# Patient Record
Sex: Female | Born: 1980 | Hispanic: No | Smoking: Never smoker
Health system: Southern US, Community
[De-identification: ages and names within clinical notes are randomized; demographics above are authoritative.]

## PROBLEM LIST (undated history)

## (undated) DIAGNOSIS — Q519 Congenital malformation of uterus and cervix, unspecified: Secondary | ICD-10-CM

## (undated) DIAGNOSIS — R87612 Low grade squamous intraepithelial lesion on cytologic smear of cervix (LGSIL): Secondary | ICD-10-CM

## (undated) DIAGNOSIS — IMO0002 Reserved for concepts with insufficient information to code with codable children: Secondary | ICD-10-CM

## (undated) DIAGNOSIS — K760 Fatty (change of) liver, not elsewhere classified: Secondary | ICD-10-CM

## (undated) DIAGNOSIS — K2970 Gastritis, unspecified, without bleeding: Secondary | ICD-10-CM

## (undated) DIAGNOSIS — G43909 Migraine, unspecified, not intractable, without status migrainosus: Secondary | ICD-10-CM

## (undated) DIAGNOSIS — R17 Unspecified jaundice: Secondary | ICD-10-CM

## (undated) HISTORY — DX: Fatty (change of) liver, not elsewhere classified: K76.0

## (undated) HISTORY — DX: Reserved for concepts with insufficient information to code with codable children: IMO0002

## (undated) HISTORY — DX: Migraine, unspecified, not intractable, without status migrainosus: G43.909

## (undated) HISTORY — DX: Gastritis, unspecified, without bleeding: K29.70

## (undated) HISTORY — DX: Unspecified jaundice: R17

## (undated) HISTORY — DX: Low grade squamous intraepithelial lesion on cytologic smear of cervix (LGSIL): R87.612

## (undated) HISTORY — DX: Congenital malformation of uterus and cervix, unspecified: Q51.9

---

## 2007-03-03 ENCOUNTER — Other Ambulatory Visit: Admission: RE | Admit: 2007-03-03 | Discharge: 2007-03-03 | Payer: Self-pay | Admitting: Gynecology

## 2008-03-20 ENCOUNTER — Other Ambulatory Visit: Admission: RE | Admit: 2008-03-20 | Discharge: 2008-03-20 | Payer: Self-pay | Admitting: Gynecology

## 2008-03-20 ENCOUNTER — Encounter: Payer: Self-pay | Admitting: Gynecology

## 2008-03-20 ENCOUNTER — Ambulatory Visit: Payer: Self-pay | Admitting: Gynecology

## 2008-03-22 ENCOUNTER — Ambulatory Visit: Payer: Self-pay | Admitting: Gynecology

## 2008-03-22 ENCOUNTER — Encounter: Payer: Self-pay | Admitting: Gynecology

## 2008-03-22 ENCOUNTER — Other Ambulatory Visit: Admission: RE | Admit: 2008-03-22 | Discharge: 2008-03-22 | Payer: Self-pay | Admitting: Gynecology

## 2008-03-29 ENCOUNTER — Encounter: Admission: RE | Admit: 2008-03-29 | Discharge: 2008-03-29 | Payer: Self-pay | Admitting: Gynecology

## 2009-03-21 ENCOUNTER — Other Ambulatory Visit: Admission: RE | Admit: 2009-03-21 | Discharge: 2009-03-21 | Payer: Self-pay | Admitting: Gynecology

## 2009-03-21 ENCOUNTER — Ambulatory Visit: Payer: Self-pay | Admitting: Gynecology

## 2009-03-21 ENCOUNTER — Encounter: Payer: Self-pay | Admitting: Gynecology

## 2009-03-21 ENCOUNTER — Ambulatory Visit: Payer: Self-pay | Admitting: *Deleted

## 2009-03-25 ENCOUNTER — Ambulatory Visit: Payer: Self-pay | Admitting: Gynecology

## 2009-03-26 ENCOUNTER — Ambulatory Visit: Payer: Self-pay | Admitting: *Deleted

## 2009-03-28 ENCOUNTER — Ambulatory Visit: Payer: Self-pay | Admitting: *Deleted

## 2009-04-04 ENCOUNTER — Ambulatory Visit: Payer: Self-pay | Admitting: *Deleted

## 2009-04-09 ENCOUNTER — Ambulatory Visit: Payer: Self-pay | Admitting: *Deleted

## 2009-04-16 ENCOUNTER — Ambulatory Visit: Payer: Self-pay | Admitting: *Deleted

## 2009-07-15 ENCOUNTER — Ambulatory Visit: Payer: Self-pay | Admitting: Gynecology

## 2009-07-15 ENCOUNTER — Other Ambulatory Visit: Admission: RE | Admit: 2009-07-15 | Discharge: 2009-07-15 | Payer: Self-pay | Admitting: Gynecology

## 2009-10-03 ENCOUNTER — Ambulatory Visit (HOSPITAL_COMMUNITY): Admission: RE | Admit: 2009-10-03 | Discharge: 2009-10-03 | Payer: Self-pay | Admitting: Obstetrics and Gynecology

## 2009-10-23 HISTORY — PX: HYSTEROSCOPY: SHX211

## 2010-01-19 ENCOUNTER — Other Ambulatory Visit: Admission: RE | Admit: 2010-01-19 | Discharge: 2010-01-19 | Payer: Self-pay | Admitting: Gynecology

## 2010-01-19 ENCOUNTER — Ambulatory Visit: Payer: Self-pay | Admitting: Gynecology

## 2010-04-21 ENCOUNTER — Ambulatory Visit: Payer: Self-pay | Admitting: Gynecology

## 2010-04-21 ENCOUNTER — Other Ambulatory Visit
Admission: RE | Admit: 2010-04-21 | Discharge: 2010-04-21 | Payer: Self-pay | Source: Home / Self Care | Admitting: Gynecology

## 2010-08-18 LAB — PREGNANCY, URINE: Preg Test, Ur: NEGATIVE

## 2010-08-18 LAB — CBC
HCT: 42.1 % (ref 36.0–46.0)
MCHC: 34.4 g/dL (ref 30.0–36.0)
RDW: 12.5 % (ref 11.5–15.5)
WBC: 7.1 10*3/uL (ref 4.0–10.5)

## 2010-10-22 ENCOUNTER — Other Ambulatory Visit: Payer: Self-pay | Admitting: Gynecology

## 2010-10-22 ENCOUNTER — Ambulatory Visit (INDEPENDENT_AMBULATORY_CARE_PROVIDER_SITE_OTHER): Payer: BC Managed Care – PPO | Admitting: Gynecology

## 2010-10-22 ENCOUNTER — Other Ambulatory Visit (HOSPITAL_COMMUNITY)
Admission: RE | Admit: 2010-10-22 | Discharge: 2010-10-22 | Disposition: A | Discharge status: Home / Self Care | Payer: BC Managed Care – PPO | Source: Ambulatory Visit | Attending: Gynecology | Admitting: Gynecology

## 2010-10-22 DIAGNOSIS — R87619 Unspecified abnormal cytological findings in specimens from cervix uteri: Secondary | ICD-10-CM | POA: Insufficient documentation

## 2010-10-22 DIAGNOSIS — N870 Mild cervical dysplasia: Secondary | ICD-10-CM

## 2011-03-04 ENCOUNTER — Other Ambulatory Visit: Payer: Self-pay | Admitting: Gynecology

## 2011-04-28 ENCOUNTER — Other Ambulatory Visit (HOSPITAL_COMMUNITY)
Admission: RE | Admit: 2011-04-28 | Discharge: 2011-04-28 | Disposition: A | Discharge status: Home / Self Care | Payer: BC Managed Care – PPO | Source: Ambulatory Visit | Attending: Gynecology | Admitting: Gynecology

## 2011-04-28 ENCOUNTER — Ambulatory Visit (INDEPENDENT_AMBULATORY_CARE_PROVIDER_SITE_OTHER): Payer: BC Managed Care – PPO | Admitting: Gynecology

## 2011-04-28 ENCOUNTER — Encounter: Payer: Self-pay | Admitting: Gynecology

## 2011-04-28 VITALS — BP 120/76 | Ht 60.0 in | Wt 139.5 lb

## 2011-04-28 DIAGNOSIS — Q519 Congenital malformation of uterus and cervix, unspecified: Secondary | ICD-10-CM

## 2011-04-28 DIAGNOSIS — Z87410 Personal history of cervical dysplasia: Secondary | ICD-10-CM

## 2011-04-28 DIAGNOSIS — Z01419 Encounter for gynecological examination (general) (routine) without abnormal findings: Secondary | ICD-10-CM

## 2011-04-28 DIAGNOSIS — Z131 Encounter for screening for diabetes mellitus: Secondary | ICD-10-CM

## 2011-04-28 DIAGNOSIS — Z13220 Encounter for screening for lipoid disorders: Secondary | ICD-10-CM

## 2011-04-28 DIAGNOSIS — R82998 Other abnormal findings in urine: Secondary | ICD-10-CM

## 2011-04-28 MED ORDER — NORGESTIMATE-ETH ESTRADIOL 0.25-35 MG-MCG PO TABS: 1.0000 | ORAL_TABLET | Freq: Every day | ORAL | Status: DC

## 2011-04-28 NOTE — Progress Notes (Signed)
Kaitlin Avila de Felipe Drone 1981/01/26 [id number]        30 y.o.  for annual exam.  Doing well. History of septated uterus, double cervix, longitudinal vaginal septum. She is status post vaginal septum excision and incision of her uterine septum by Dr. April Manson May 2011. She also has a history of low-grade SIL in 2010, her follow up Pap smears have been negative.  Past medical history,surgical history, medications, allergies, family history and social history were all reviewed and documented in the EPIC chart. ROS:  Was performed and pertinent positives and negatives are included in the history.  Exam: chaperone present Filed Vitals:   04/28/11 0858  BP: 120/76   General appearance  Normal Skin grossly normal Head/Neck normal with no cervical or supraclavicular adenopathy thyroid normal Lungs  clear Cardiac RR, without RMG Abdominal  soft, nontender, without masses, organomegaly or hernia Breasts  examined lying and sitting without masses, retractions, discharge or axillary adenopathy. Pelvic  Ext/BUS/vagina  normal  Cervix  Double-barreled separate cervices right and left Pap smears done  Uterus  axial, normal size, shape and contour, midline and mobile nontender   Adnexa  Without masses or tenderness    Anus and perineum  normal     Assessment/Plan:  30 y.o. female for annual exam.    1. 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 Underwent vaginal septum excision with incision of her uterine septum by Dr. April Manson in May 2011. 2. History cervical dysplasia. Patient has history of low-grade SIL changes in 2010. Her Pap smears on both right and left cervixes were normal February 2011, August 2011, November 2011 and May 2012. I did a Pap smear today if normal discussed annual repeat and at some point we'll go to a less frequent screening interval per current  guidelines. 3. Birth control. Patient is on low-dose oral contraceptives doing well and wants to continue. I reviewed the risks of thrombosis to include stroke heart attack DVT. Patient accepts and I refilled her x1 year. 4. Breast health. SBE monthly reviewed. 5. Health maintenance. CBC glucose lipid profile urinalysis were ordered. Assuming she continues well from a gynecologic standpoint she will see me in a year sooner as needed.    [name] MD, 9:28 AM 04/28/2011

## 2011-04-28 NOTE — Progress Notes (Signed)
Addended by: Richardson Chiquito on: 04/28/2011 02:53 PM   Modules accepted: Orders

## 2012-05-03 ENCOUNTER — Ambulatory Visit (INDEPENDENT_AMBULATORY_CARE_PROVIDER_SITE_OTHER): Payer: BC Managed Care – PPO | Admitting: Gynecology

## 2012-05-03 ENCOUNTER — Encounter: Payer: Self-pay | Admitting: Gynecology

## 2012-05-03 ENCOUNTER — Other Ambulatory Visit (HOSPITAL_COMMUNITY)
Admission: RE | Admit: 2012-05-03 | Discharge: 2012-05-03 | Disposition: A | Discharge status: Home / Self Care | Payer: BC Managed Care – PPO | Source: Ambulatory Visit | Attending: Gynecology | Admitting: Gynecology

## 2012-05-03 VITALS — BP 130/62 | Ht 60.0 in | Wt 133.0 lb

## 2012-05-03 DIAGNOSIS — Z01419 Encounter for gynecological examination (general) (routine) without abnormal findings: Secondary | ICD-10-CM | POA: Insufficient documentation

## 2012-05-03 DIAGNOSIS — Z131 Encounter for screening for diabetes mellitus: Secondary | ICD-10-CM

## 2012-05-03 DIAGNOSIS — R102 Pelvic and perineal pain: Secondary | ICD-10-CM

## 2012-05-03 DIAGNOSIS — Z13220 Encounter for screening for lipoid disorders: Secondary | ICD-10-CM

## 2012-05-03 DIAGNOSIS — N949 Unspecified condition associated with female genital organs and menstrual cycle: Secondary | ICD-10-CM

## 2012-05-03 LAB — CBC WITH DIFFERENTIAL/PLATELET
Lymphocytes Relative: 44 % (ref 12–46)
Lymphs Abs: 2.7 10*3/uL (ref 0.7–4.0)
MCHC: 33 g/dL (ref 30.0–36.0)
MCV: 92.8 fL (ref 78.0–100.0)
Monocytes Absolute: 0.5 10*3/uL (ref 0.1–1.0)
Monocytes Relative: 9 % (ref 3–12)
Neutro Abs: 2.7 10*3/uL (ref 1.7–7.7)
RDW: 13.3 % (ref 11.5–15.5)
WBC: 6 10*3/uL (ref 4.0–10.5)

## 2012-05-03 LAB — LIPID PANEL: VLDL: 16 mg/dL (ref 0–40)

## 2012-05-03 MED ORDER — NORGESTIMATE-ETH ESTRADIOL 0.25-35 MG-MCG PO TABS: 1.0000 | ORAL_TABLET | Freq: Every day | ORAL | Status: DC

## 2012-05-03 NOTE — Progress Notes (Signed)
Kaitlin Avila de Felipe Drone Oct 17, 1980 [id number]        31 y.o.  G0P0 for annual exam.  Several issues noted below  Past medical history,surgical history, medications, allergies, family history and social history were all reviewed and documented in the EPIC chart. ROS:  Was performed and pertinent positives and negatives are included in the history.  Exam: Sherrilyn Rist assistant Filed Vitals:   05/03/12 0942  BP: 130/62  Height: 5' (1.524 m)  Weight: 133 lb (60.328 kg)   General appearance  Normal Skin grossly normal Head/Neck normal with no cervical or supraclavicular adenopathy thyroid normal Lungs  clear Cardiac RR, without RMG Abdominal  soft, nontender, without masses, organomegaly or hernia Breasts  examined lying and sitting without masses, retractions, discharge or axillary adenopathy. Pelvic  Ext/BUS/vagina  normal   Cervix  Double barrel cervix with 2 offices. Pap of right and left cervix done  Uterus  Generous in size, midline and mobile nontender   Adnexa  Without masses or tenderness    Anus and perineum  normal   Rectovaginal  normal sphincter tone without palpated masses or tenderness.    Assessment/Plan:  31 y.o. G0P0 female for annual exam.  1. History of uterine/vaginal septum lysed by Dr. April Manson. Has double cervix with history of LGSIL right side 2010 and ASCUS negative high risk HPV left side 2011. Follow up Pap smears 2011 and 2012 normal. Pap of age cervix done today. If negative then plan less frequent screening at every 3 years. 2. Fleeting pelvic pain right and left. I think this is probably physiologic but will check ultrasound for baseline rule out ovarian process and she'll follow up for this. 3. Birth control. Patient on BCPs and wants to continue it I refilled her times a year. Recommended multivitamin with folic acid now as they are contemplating pregnancy sometime in the next year or 2. Need to follow up early for obstetrical care given history of  uterine/cervical abnormalities. 4. Breast health. SBE monthly reviewed. 5. Health maintenance. Baseline CBC lipid profile glucose urinalysis ordered. Follow for ultrasound otherwise annually.    [name] MD, 10:24 AM 05/03/2012

## 2012-05-03 NOTE — Patient Instructions (Signed)
Follow up for ultrasound Follow up in one year for annual exam

## 2012-05-04 LAB — URINALYSIS W MICROSCOPIC + REFLEX CULTURE
Bacteria, UA: NONE SEEN
Bilirubin Urine: NEGATIVE
Casts: NONE SEEN
Crystals: NONE SEEN
Leukocytes, UA: NEGATIVE
Squamous Epithelial / LPF: NONE SEEN
Urobilinogen, UA: 0.2 mg/dL (ref 0.0–1.0)

## 2012-05-12 ENCOUNTER — Encounter: Payer: Self-pay | Admitting: Gynecology

## 2012-05-12 ENCOUNTER — Ambulatory Visit (INDEPENDENT_AMBULATORY_CARE_PROVIDER_SITE_OTHER): Payer: BC Managed Care – PPO

## 2012-05-12 ENCOUNTER — Ambulatory Visit (INDEPENDENT_AMBULATORY_CARE_PROVIDER_SITE_OTHER): Payer: BC Managed Care – PPO | Admitting: Gynecology

## 2012-05-12 DIAGNOSIS — R102 Pelvic and perineal pain: Secondary | ICD-10-CM

## 2012-05-12 DIAGNOSIS — N949 Unspecified condition associated with female genital organs and menstrual cycle: Secondary | ICD-10-CM

## 2012-05-12 NOTE — Progress Notes (Signed)
Patient presents for ultrasound. History of fleeting right and left pelvic pain.  Ultrasound is normal noting normal uterine size and echotexture. Endometrial echo 5.2 mm. Right left ear was visualized with physiologic changes. Cul-de-sac negative.  Assessment and plan: Fleeting pelvic pain. Patient will monitor as long as not bothersome or results will follow. Otherwise if persists or worsens consider laparoscopy.

## 2012-05-12 NOTE — Patient Instructions (Signed)
Follow up routinely, sooner if any issues.

## 2012-05-16 ENCOUNTER — Encounter: Payer: Self-pay | Admitting: Gynecology

## 2012-05-31 DIAGNOSIS — A749 Chlamydial infection, unspecified: Secondary | ICD-10-CM

## 2012-05-31 HISTORY — DX: Chlamydial infection, unspecified: A74.9

## 2012-05-31 NOTE — L&D Delivery Note (Signed)
Operative Delivery Note At 7:49 PM a viable and healthy female was delivered via Vaginal, Vacuum Investment banker, operational).  Presentation: vertex; Position: Occiput,, Anterior; Station: +3.  Verbal consent: obtained from patient.  Risks and benefits discussed in detail.  Risks include, but are not limited to the risks of anesthesia, bleeding, infection, damage to maternal tissues, fetal cephalhematoma.  There is also the risk of inability to effect vaginal delivery of the head, or shoulder dystocia that cannot be resolved by established maneuvers, leading to the need for emergency cesarean section.  APGAR: 6, 9; weight 6 lb 13.2 oz (3096 g).   Placenta status: , Spontaneous.  Intact not sent Cord: 3 vessels with the following complications: None.  CAN x 1 reducible Cord pH: none  Anesthesia: Epidural  Instruments: mushroom vacuum Episiotomy: None Lacerations: 2nd degree;Perineal;Labial ( right) and left interlabial fold; left Sulcus;Vaginal Suture Repair: 3.0 chromic Est. Blood Loss (mL): 350  Mom to postpartum.  Baby to Couplet care / Skin to Skin.  Kaitlin Avila A 05/30/2013, 9:08 PM

## 2012-07-15 ENCOUNTER — Other Ambulatory Visit: Payer: Self-pay

## 2012-10-24 ENCOUNTER — Encounter: Payer: Self-pay | Admitting: Gynecology

## 2012-10-24 ENCOUNTER — Ambulatory Visit (INDEPENDENT_AMBULATORY_CARE_PROVIDER_SITE_OTHER): Payer: BC Managed Care – PPO | Admitting: Gynecology

## 2012-10-24 DIAGNOSIS — N912 Amenorrhea, unspecified: Secondary | ICD-10-CM

## 2012-10-24 DIAGNOSIS — O34599 Maternal care for other abnormalities of gravid uterus, unspecified trimester: Secondary | ICD-10-CM

## 2012-10-24 DIAGNOSIS — O34591 Maternal care for other abnormalities of gravid uterus, first trimester: Secondary | ICD-10-CM

## 2012-10-24 NOTE — Progress Notes (Signed)
Patient presents with LMP 08/30/2012 and positive home pregnancy test. No bleeding or cramping or significant pain since then.  Exam was Kaitlin Avila Abdomen soft nontender without masses guarding rebound organomegaly. Pelvic external BUS vagina normal. Cervix with double barrel right and left cervix cyst. Bimanual with globoid uterus retroverted nontender. Adnexa without masses or tenderness  Assessment and plan: IUP 7 weeks by dates. Exam consistent with dates. History of vaginal septum, septated uterus and double cervices. Status post hysteroscopic excision of vaginal septum and uterine septum by Dr. April Manson start with ultrasound now for documentation of viability. Risk of preterm delivery reviewed. Need to establish obstetrical care early discussed and she knows that she'll need to make an appointment to see an obstetrician as we do not do obstetrics. She is on a multivitamin with folic acid.

## 2012-10-24 NOTE — Patient Instructions (Signed)
Followup for ultrasound as scheduled Make an appointment to see an obstetrician for ongoing obstetrical care

## 2012-11-07 LAB — OB RESULTS CONSOLE RUBELLA ANTIBODY, IGM: Rubella: IMMUNE

## 2012-11-07 LAB — OB RESULTS CONSOLE ANTIBODY SCREEN: Antibody Screen: NEGATIVE

## 2012-11-07 LAB — OB RESULTS CONSOLE ABO/RH: RH Type: POSITIVE

## 2012-11-07 LAB — OB RESULTS CONSOLE HIV ANTIBODY (ROUTINE TESTING): HIV: NONREACTIVE

## 2012-11-07 LAB — OB RESULTS CONSOLE HEPATITIS B SURFACE ANTIGEN: Hepatitis B Surface Ag: NEGATIVE

## 2012-11-15 ENCOUNTER — Other Ambulatory Visit: Payer: BC Managed Care – PPO

## 2012-11-15 ENCOUNTER — Ambulatory Visit: Payer: BC Managed Care – PPO | Admitting: Gynecology

## 2013-04-05 ENCOUNTER — Other Ambulatory Visit: Payer: Self-pay

## 2013-05-09 LAB — OB RESULTS CONSOLE GBS: GBS: NEGATIVE

## 2013-05-30 ENCOUNTER — Encounter (HOSPITAL_COMMUNITY): Payer: BC Managed Care – PPO | Admitting: Anesthesiology

## 2013-05-30 ENCOUNTER — Inpatient Hospital Stay (HOSPITAL_COMMUNITY)
Admission: AD | Admit: 2013-05-30 | Discharge: 2013-06-01 | DRG: 775 | Disposition: A | Discharge status: Home / Self Care | Payer: BC Managed Care – PPO | Source: Ambulatory Visit | Attending: Obstetrics and Gynecology | Admitting: Obstetrics and Gynecology

## 2013-05-30 ENCOUNTER — Inpatient Hospital Stay (HOSPITAL_COMMUNITY): Payer: BC Managed Care – PPO | Admitting: Anesthesiology

## 2013-05-30 ENCOUNTER — Encounter (HOSPITAL_COMMUNITY): Payer: Self-pay | Admitting: *Deleted

## 2013-05-30 DIAGNOSIS — Q5128 Other doubling of uterus, other specified: Secondary | ICD-10-CM

## 2013-05-30 DIAGNOSIS — O3400 Maternal care for unspecified congenital malformation of uterus, unspecified trimester: Secondary | ICD-10-CM | POA: Diagnosis present

## 2013-05-30 LAB — CBC
Hemoglobin: 13.4 g/dL (ref 12.0–15.0)
MCH: 31.1 pg (ref 26.0–34.0)
MCHC: 34.8 g/dL (ref 30.0–36.0)
MCV: 89.3 fL (ref 78.0–100.0)
Platelets: 247 10*3/uL (ref 150–400)
RBC: 4.31 MIL/uL (ref 3.87–5.11)
RDW: 12.9 % (ref 11.5–15.5)

## 2013-05-30 LAB — TYPE AND SCREEN
ABO/RH(D): O POS
Antibody Screen: NEGATIVE

## 2013-05-30 LAB — RPR: RPR Ser Ql: NONREACTIVE

## 2013-05-30 MED ORDER — ONDANSETRON HCL 4 MG PO TABS: 4.0000 mg | ORAL_TABLET | ORAL | Status: DC | PRN

## 2013-05-30 MED ORDER — OXYTOCIN 40 UNITS IN LACTATED RINGERS INFUSION - SIMPLE MED
62.5000 mL/h | INTRAVENOUS | Status: DC
  Filled 2013-05-30: qty 1000

## 2013-05-30 MED ORDER — WITCH HAZEL-GLYCERIN EX PADS: 1.0000 "application " | MEDICATED_PAD | CUTANEOUS | Status: DC | PRN

## 2013-05-30 MED ORDER — OXYCODONE-ACETAMINOPHEN 5-325 MG PO TABS: 1.0000 | ORAL_TABLET | ORAL | Status: DC | PRN

## 2013-05-30 MED ORDER — BUTORPHANOL TARTRATE 1 MG/ML IJ SOLN
1.0000 mg | INTRAMUSCULAR | Status: DC | PRN
  Administered 2013-05-30: 1 mg via INTRAVENOUS
  Filled 2013-05-30: qty 1

## 2013-05-30 MED ORDER — ZOLPIDEM TARTRATE 5 MG PO TABS: 5.0000 mg | ORAL_TABLET | Freq: Every evening | ORAL | Status: DC | PRN

## 2013-05-30 MED ORDER — DIPHENHYDRAMINE HCL 50 MG/ML IJ SOLN: 12.5000 mg | INTRAMUSCULAR | Status: DC | PRN

## 2013-05-30 MED ORDER — PHENYLEPHRINE 40 MCG/ML (10ML) SYRINGE FOR IV PUSH (FOR BLOOD PRESSURE SUPPORT)
80.0000 ug | PREFILLED_SYRINGE | INTRAVENOUS | Status: DC | PRN
  Filled 2013-05-30: qty 2
  Filled 2013-05-30: qty 10

## 2013-05-30 MED ORDER — TERBUTALINE SULFATE 1 MG/ML IJ SOLN: 0.2500 mg | Freq: Once | INTRAMUSCULAR | Status: DC | PRN

## 2013-05-30 MED ORDER — OXYTOCIN 40 UNITS IN LACTATED RINGERS INFUSION - SIMPLE MED
1.0000 m[IU]/min | INTRAVENOUS | Status: DC
  Administered 2013-05-30: 2 m[IU]/min via INTRAVENOUS

## 2013-05-30 MED ORDER — FENTANYL 2.5 MCG/ML BUPIVACAINE 1/10 % EPIDURAL INFUSION (WH - ANES)
14.0000 mL/h | INTRAMUSCULAR | Status: DC | PRN
  Administered 2013-05-30 (×2): 14 mL/h via EPIDURAL
  Filled 2013-05-30 (×2): qty 125

## 2013-05-30 MED ORDER — FLEET ENEMA 7-19 GM/118ML RE ENEM: 1.0000 | ENEMA | RECTAL | Status: DC | PRN

## 2013-05-30 MED ORDER — ONDANSETRON HCL 4 MG/2ML IJ SOLN: 4.0000 mg | INTRAMUSCULAR | Status: DC | PRN

## 2013-05-30 MED ORDER — IBUPROFEN 600 MG PO TABS
600.0000 mg | ORAL_TABLET | Freq: Four times a day (QID) | ORAL | Status: DC
  Administered 2013-05-30 – 2013-06-01 (×8): 600 mg via ORAL
  Filled 2013-05-30 (×8): qty 1

## 2013-05-30 MED ORDER — LACTATED RINGERS IV SOLN: 500.0000 mL | Freq: Once | INTRAVENOUS | Status: DC

## 2013-05-30 MED ORDER — SIMETHICONE 80 MG PO CHEW: 80.0000 mg | CHEWABLE_TABLET | ORAL | Status: DC | PRN

## 2013-05-30 MED ORDER — EPHEDRINE 5 MG/ML INJ
10.0000 mg | INTRAVENOUS | Status: DC | PRN
Start: 2013-05-30 — End: 2013-05-30
  Filled 2013-05-30: qty 2

## 2013-05-30 MED ORDER — LACTATED RINGERS IV SOLN
INTRAVENOUS | Status: DC
  Administered 2013-05-30: 150 mL/h via INTRAUTERINE

## 2013-05-30 MED ORDER — EPHEDRINE 5 MG/ML INJ
10.0000 mg | INTRAVENOUS | Status: DC | PRN
  Filled 2013-05-30: qty 2
  Filled 2013-05-30: qty 4

## 2013-05-30 MED ORDER — FERROUS SULFATE 325 (65 FE) MG PO TABS
325.0000 mg | ORAL_TABLET | Freq: Two times a day (BID) | ORAL | Status: DC
  Administered 2013-05-31: 325 mg via ORAL
  Filled 2013-05-30: qty 1

## 2013-05-30 MED ORDER — LANOLIN HYDROUS EX OINT: TOPICAL_OINTMENT | CUTANEOUS | Status: DC | PRN

## 2013-05-30 MED ORDER — CITRIC ACID-SODIUM CITRATE 334-500 MG/5ML PO SOLN: 30.0000 mL | ORAL | Status: DC | PRN

## 2013-05-30 MED ORDER — LIDOCAINE HCL (PF) 1 % IJ SOLN
30.0000 mL | INTRAMUSCULAR | Status: DC | PRN
  Administered 2013-05-30: 30 mL via SUBCUTANEOUS
  Filled 2013-05-30 (×2): qty 30

## 2013-05-30 MED ORDER — OXYTOCIN BOLUS FROM INFUSION
500.0000 mL | INTRAVENOUS | Status: DC
  Administered 2013-05-30: 500 mL via INTRAVENOUS

## 2013-05-30 MED ORDER — PRENATAL MULTIVITAMIN CH
1.0000 | ORAL_TABLET | Freq: Every day | ORAL | Status: DC
  Administered 2013-05-31 – 2013-06-01 (×2): 1 via ORAL
  Filled 2013-05-30 (×2): qty 1

## 2013-05-30 MED ORDER — DIPHENHYDRAMINE HCL 25 MG PO CAPS: 25.0000 mg | ORAL_CAPSULE | Freq: Four times a day (QID) | ORAL | Status: DC | PRN

## 2013-05-30 MED ORDER — DIBUCAINE 1 % RE OINT: 1.0000 "application " | TOPICAL_OINTMENT | RECTAL | Status: DC | PRN

## 2013-05-30 MED ORDER — IBUPROFEN 600 MG PO TABS: 600.0000 mg | ORAL_TABLET | Freq: Four times a day (QID) | ORAL | Status: DC | PRN

## 2013-05-30 MED ORDER — LIDOCAINE HCL (PF) 1 % IJ SOLN
INTRAMUSCULAR | Status: DC | PRN
  Administered 2013-05-30 (×2): 5 mL

## 2013-05-30 MED ORDER — LACTATED RINGERS IV SOLN
500.0000 mL | INTRAVENOUS | Status: DC | PRN
  Administered 2013-05-30: 500 mL via INTRAVENOUS

## 2013-05-30 MED ORDER — ACETAMINOPHEN 325 MG PO TABS: 650.0000 mg | ORAL_TABLET | ORAL | Status: DC | PRN

## 2013-05-30 MED ORDER — ONDANSETRON HCL 4 MG/2ML IJ SOLN: 4.0000 mg | Freq: Four times a day (QID) | INTRAMUSCULAR | Status: DC | PRN

## 2013-05-30 MED ORDER — LACTATED RINGERS IV SOLN
INTRAVENOUS | Status: DC
  Administered 2013-05-30 (×3): 125 mL/h via INTRAVENOUS
  Administered 2013-05-30: 06:00:00 via INTRAVENOUS

## 2013-05-30 MED ORDER — BENZOCAINE-MENTHOL 20-0.5 % EX AERO
1.0000 "application " | INHALATION_SPRAY | CUTANEOUS | Status: DC | PRN
  Administered 2013-05-30: 1 via TOPICAL
  Filled 2013-05-30: qty 56

## 2013-05-30 MED ORDER — PHENYLEPHRINE 40 MCG/ML (10ML) SYRINGE FOR IV PUSH (FOR BLOOD PRESSURE SUPPORT)
80.0000 ug | PREFILLED_SYRINGE | INTRAVENOUS | Status: DC | PRN
  Filled 2013-05-30: qty 2

## 2013-05-30 MED ORDER — SENNOSIDES-DOCUSATE SODIUM 8.6-50 MG PO TABS
2.0000 | ORAL_TABLET | ORAL | Status: DC
Start: 2013-05-31 — End: 2013-06-01
  Administered 2013-05-30 – 2013-05-31 (×2): 2 via ORAL
  Filled 2013-05-30 (×2): qty 2

## 2013-05-30 NOTE — Anesthesia Preprocedure Evaluation (Signed)
Anesthesia Evaluation  Patient identified by MRN, date of birth, ID band Patient awake    Reviewed: Allergy & Precautions, H&P , Patient's Chart, lab work & pertinent test results  Airway Mallampati: II TM Distance: >3 FB Neck ROM: full    Dental   Pulmonary  breath sounds clear to auscultation        Cardiovascular Rhythm:regular Rate:Normal     Neuro/Psych  Headaches,    GI/Hepatic   Endo/Other    Renal/GU      Musculoskeletal   Abdominal   Peds  Hematology   Anesthesia Other Findings   Reproductive/Obstetrics (+) Pregnancy                           Anesthesia Physical Anesthesia Plan  ASA: II  Anesthesia Plan: Epidural   Post-op Pain Management:    Induction:   Airway Management Planned:   Additional Equipment:   Intra-op Plan:   Post-operative Plan:   Informed Consent: I have reviewed the patients History and Physical, chart, labs and discussed the procedure including the risks, benefits and alternatives for the proposed anesthesia with the patient or authorized representative who has indicated his/her understanding and acceptance.     Plan Discussed with:   Anesthesia Plan Comments:         Anesthesia Quick Evaluation  

## 2013-05-30 NOTE — H&P (Signed)
Kaitlin Avila de Felipe Drone is a 32 y.o. female presenting for admission in labor. Intact membrane. (-)GBS. Hx of double cervix w/ removal of vaginal septum  Maternal Medical History:  Reason for admission: Contractions.   Fetal activity: Perceived fetal activity is normal.      OB History   Grav Para Term Preterm Abortions TAB SAB Ect Mult Living   1              Past Medical History  Diagnosis Date  . Migraines   . LSIL (low grade squamous intraepithelial lesion) on Pap smear     RIGHT - 10/10... ASCUS LEFT - NEGATIVE HR HPV 07/2009  . Congenital uterine anomaly     septated uterus, double cervices, longitudinal vaginal septum   Past Surgical History  Procedure Laterality Date  . Hysteroscopy  10/23/2009    EXCISION OF VAGINAL and UTERINE SEPTUM.    Family History: family history includes Diabetes in her paternal grandmother; Hypertension in her mother and sister. Social History:  reports that she has never smoked. She has never used smokeless tobacco. She reports that she drinks alcohol. She reports that she does not use illicit drugs.   Prenatal Transfer Tool  Maternal Diabetes: No Genetic Screening: Declined Maternal Ultrasounds/Referrals: Normal Fetal Ultrasounds or other Referrals:  None Maternal Substance Abuse:  No Significant Maternal Medications:  None Significant Maternal Lab Results:  Lab values include: Group B Strep negative Other Comments:  None  Review of Systems  All other systems reviewed and are negative.    Dilation: 4 Effacement (%): 90 Station: -2 Exam by:: Claud Kelp rN Blood pressure 118/83, pulse 86, temperature 97.9 F (36.6 C), temperature source Oral, resp. rate 18, height 5' (1.524 m), weight 69.57 kg (153 lb 6 oz), last menstrual period 08/30/2012, SpO2 97.00%. Exam Physical Exam  Constitutional: She is oriented to person, place, and time. She appears well-developed and well-nourished.  HENT:  Head: Atraumatic.  Eyes: EOM are normal.   Neck: Neck supple.  Cardiovascular: Regular rhythm.   Respiratory: Breath sounds normal.  GI: Soft.  Musculoskeletal: She exhibits no edema.  Neurological: She is alert and oriented to person, place, and time.  Skin: Skin is warm and dry.  Psychiatric: She has a normal mood and affect.    Prenatal labs: ABO, Rh: O/Positive/-- (06/10 0000) Antibody: Negative (06/10 0000) Rubella: Immune (06/10 0000) RPR: Nonreactive (06/10 0000)  HBsAg: Negative (06/10 0000)  HIV: Non-reactive (06/10 0000)  GBS: Negative (12/10 0000)   Assessment/Plan:Active labor Term gestation. Plans natural labor P) admit routine labs. Analgesic prn amniotomy prn   Williard Keller A 05/30/2013, 6:22 AM

## 2013-05-30 NOTE — MAU Note (Signed)
Contractions woke pt. Up at 0230.  They have gotten closer together and more intense.  Pt. Is having some bloody show.

## 2013-05-30 NOTE — Progress Notes (Signed)
S: comfortable after epidural  O: VE 9.5 cm /100/0/+1 station small amt of lip ant Tracing; baseline 140 occ variable ? Ctx freq ( q 2-3 mins prior to epidural)  IMP: Active phase progressing well w/o pitocin. Intact membrane P) cont present mgmt

## 2013-05-30 NOTE — Progress Notes (Signed)
S :Called by RN for pt who is fully +2 station and known deep variable when we pushed earlier  O: exam: fully (+) 2 station with small caput . LOA Pt advised may need vacuum assistance. Procedure explained and poss risk disc.  Pt  Pushed x 1 and FHR decel down to 70's -80"s for 3 mins over the span of 2 ctx. Mat O2 was on board. FHR then returned to baseline 145-150. Good variability even within decel. Ctx q 2 mins NICu called for vacuum delivery. ISE removed. Foley removed as was IUPC  IMP: Complete w/ variable decels due to cord compression P)  Vacuum assisted delivery

## 2013-05-30 NOTE — Progress Notes (Signed)
External re applied

## 2013-05-30 NOTE — Progress Notes (Signed)
Pt back to bed after kneeling at bedside

## 2013-05-30 NOTE — Progress Notes (Signed)
S; denies any pelvic pressure  O: VE unchanged. Reduced small fleshy lip with 2 pushes. Baby however is transverse/asynclytic. AROM clear fluid. IUPC placed 2nd to deep variables noted with the two pushes. ISE placed  Tracing> good variability. (+) baseline 145-150 ? Ctx freq  IMP: protracted/arrest of dilation ? 2nd to suboptimal ctx P) amnioinfusion. Exaggerated right sims close fetal monitoring. May need pitocin augmentation. Pt and husband advised of the current issue and poss need for C/s

## 2013-05-30 NOTE — Progress Notes (Signed)
Applying [place] to pt

## 2013-05-30 NOTE — Anesthesia Procedure Notes (Signed)
Epidural Patient location during procedure: OB Start time: 05/30/2013 11:12 AM  Staffing Anesthesiologist: Brayton Caves Performed by: anesthesiologist   Preanesthetic Checklist Completed: patient identified, site marked, surgical consent, pre-op evaluation, timeout performed, IV checked, risks and benefits discussed and monitors and equipment checked  Epidural Patient position: sitting Prep: site prepped and draped and DuraPrep Patient monitoring: continuous pulse ox and blood pressure Approach: midline Injection technique: LOR air  Needle:  Needle type: Tuohy  Needle gauge: 17 G Needle length: 9 cm and 9 Needle insertion depth: 5 cm cm Catheter type: closed end flexible Catheter size: 19 Gauge Catheter at skin depth: 10 cm Test dose: negative  Assessment Events: blood not aspirated, injection not painful, no injection resistance, negative IV test and no paresthesia  Additional Notes Patient identified.  Risk benefits discussed including failed block, incomplete pain control, headache, nerve damage, paralysis, blood pressure changes, nausea, vomiting, reactions to medication both toxic or allergic, and postpartum back pain.  Patient expressed understanding and wished to proceed.  All questions were answered.  Sterile technique used throughout procedure and epidural site dressed with sterile barrier dressing. No paresthesia or other complications noted.The patient did not experience any signs of intravascular injection such as tinnitus or metallic taste in mouth nor signs of intrathecal spread such as rapid motor block. Please see nursing notes for vital signs.

## 2013-05-30 NOTE — Progress Notes (Signed)
IUPC stopped tracing. Began tracing after IUPC flushed

## 2013-05-30 NOTE — Progress Notes (Signed)
Dr Cherly Hensen reviewed strip

## 2013-05-30 NOTE — Consult Note (Signed)
Neonatology Note:  Attendance at Delivery:  I was asked by Dr. Cousins to attend this vacuum-assisted vaginal delivery at term following NRFHR. The mother is a G1P0 O pos, GBS neg with septate uterus, double cervices, and longitudinal vaginal septum. ROM 5 hours prior to delivery, fluid clear. CAN times 1 at delivery. Infant was floppy, dusky, and without spontaneous cry at birth. We bulb suctioned and gave vigorous stimulation, to which the baby responded by crying at about 45 seconds of life. Her tone and color improved gradually and were normal by 4-5 minutes of life. Ap 6/9. Lungs clear to ausc in DR. To CN to care of Pediatrician.  Jodeen Mclin C. Haelie Clapp, MD  

## 2013-05-30 NOTE — Progress Notes (Signed)
fhr intermittent due to pt position and movement. Extra belt applied

## 2013-05-31 LAB — CBC
HCT: 29.2 % — ABNORMAL LOW (ref 36.0–46.0)
Hemoglobin: 10.1 g/dL — ABNORMAL LOW (ref 12.0–15.0)
MCH: 31.6 pg (ref 26.0–34.0)
MCHC: 35.3 g/dL (ref 30.0–36.0)
MCV: 89.6 fL (ref 78.0–100.0)
Platelets: 183 10*3/uL (ref 150–400)
RBC: 3.26 MIL/uL — ABNORMAL LOW (ref 3.87–5.11)
RDW: 13.1 % (ref 11.5–15.5)
WBC: 19.9 10*3/uL — ABNORMAL HIGH (ref 4.0–10.5)

## 2013-05-31 NOTE — Progress Notes (Signed)
PPD 1 VAVD  S:  Reports feeling tired and sore             Tolerating po/ No nausea or vomiting             Bleeding is light             Pain controlled with motrin and percocet             Up ad lib / ambulatory / voiding QS  Newborn breast feeding  / female  O:               VS: BP 92/58  Pulse 83  Temp(Src) 98.2 F (36.8 C) (Oral)  Resp 18  Ht 5' (1.524 m)  Wt 69.57 kg (153 lb 6 oz)  BMI 29.95 kg/m2  SpO2 97%  LMP 08/30/2012   LABS:              Recent Labs  05/30/13 0625 05/31/13 0607  WBC 17.1* 19.9*  HGB 13.4 10.1*  PLT 247 183               Blood type: --/--/O POS, O POS (12/31 [id number])  Rubella: Immune (06/10 0000)                     I&O: Intake/Output     12/31 0701 - 01/01 0700 01/01 0701 - 01/02 0700   Urine (mL/kg/hr) 700 (0.4)    Blood 350 (0.2)    Total Output 1050     Net -1050                        Physical Exam:             Alert and oriented X3  Lungs: Clear and unlabored  Heart: regular rate and rhythm / no mumurs  Abdomen: soft, non-tender, non-distended              Fundus: firm, non-tender, U-1  Perineum: only mild edema / ice pack in place  Lochia: ligt  Extremities: no edema, no calf pain or tenderness    A: PPD # 1   Doing well - stable status  P: Routine post partum orders  Anticipate DC tomorrow  Marlinda MikeBAILEY, Zetha Kuhar CNM, MSN, [facility]ACNM 05/31/2013, 9:02 AM

## 2013-05-31 NOTE — Anesthesia Postprocedure Evaluation (Signed)
  Anesthesia Post-op Note  Patient: Kaitlin Avila  Procedure(s) Performed: * No procedures listed *  Patient Location: PACU and Mother/Baby  Anesthesia Type:Epidural  Level of Consciousness: awake, alert  and oriented  Airway and Oxygen Therapy: Patient Spontanous Breathing  Post-op Pain: mild  Post-op Assessment: Patient's Cardiovascular Status Stable, Respiratory Function Stable, No signs of Nausea or vomiting and Pain level controlled  Post-op Vital Signs: stable  Complications: No apparent anesthesia complications

## 2013-05-31 NOTE — Lactation Note (Signed)
This note was copied from the chart of Kaitlin Tillman AbideMaria Chavez de Avila. Lactation Consultation Note: Initial visit with mom, She reports that baby nursed well for 20 minutes after delivery but has been sleepy since. No 19 hours old. Has had 3 stools and 2 void s since delivery. Undressed baby and placed in football position. Baby awake but would not latch.Off to sleep. Encouraged skin to skin and to watch for feeding cues and feed whenever she sees them. Reviewed normal newborn behavior the first 24 hours and cluster feeding the 2nd night, BF brochure given to mom with resources for support after DC.  No questions at present. To call for assist prn  Patient Name: Kaitlin Cora DanielsMaria Chavez de Avila Today's Date: 05/31/2013 Reason for consult: Initial assessment   Maternal Data Formula Feeding for Exclusion: No Infant to breast within first hour of birth: Yes Has patient been taught Hand Expression?: Yes Does the patient have breastfeeding experience prior to this delivery?: No  Feeding Feeding Type: Breast Fed  LATCH Score/Interventions Latch: Too sleepy or reluctant, no latch achieved, no sucking elicited.  Audible Swallowing: None  Type of Nipple: Flat  Comfort (Breast/Nipple): Soft / non-tender     Hold (Positioning): Assistance needed to correctly position infant at breast and maintain latch. Intervention(s): Breastfeeding basics reviewed;Support Pillows;Skin to skin  LATCH Score: 4  Lactation Tools Discussed/Used     Consult Status Consult Status: Follow-up Date: 06/01/13 Follow-up type: In-patient    Pamelia HoitWeeks, Cobi Delph D 05/31/2013, 3:21 PM

## 2013-06-01 MED ORDER — IBUPROFEN 600 MG PO TABS: 600.0000 mg | ORAL_TABLET | Freq: Four times a day (QID) | ORAL | Status: DC

## 2013-06-01 NOTE — Lactation Note (Signed)
This note was copied from the chart of Kaitlin Avila AbideMaria Chavez de Avila. Lactation Consultation Note  Patient Name: Kaitlin Avila ONGEX'BToday's Date: 06/01/2013 Reason for consult: Follow-up assessment;Difficult latch Mom is having difficulty with latching baby. Mom reports baby will latch on right breast but not the left. The left nipple/aerola is thick, tough, non-compressible, flat. Mom was given #20 nipple shield to help with latch. Changed to size 24 for better fit. Mom reports baby is able to latch on the right breast without the nipple shield although with some feedings she will use the nipple shield on both breast. RN reported this am baby would not sustain a latch on the left breast with or without the nipple shield, LC advised to set up SNS with 5 fr feeding tube, RN reported baby took nipple shield well with supplement. At this visit, baby sleepy but was able to latch to the left breast with #24 nipple shield, initiated an SNS to give formula supplement to keep baby nursing and not become frustrated. Baby demonstrated a good rhythmic suck and took 15 ml of formula using SNS at left breast with nipple shield. Mom is getting her own DEBP from insurance. Plan given to Mom: Pre-pump for 3-5 minutes to get her milk flow going and to help with latch. Alternate 1st breast each feeding. When nursing on left breast use #24 nipple shield/supplement using SNS when nursing on left breast. Follow supplementing guidelines given starting with 15 ml each feeding today. Post pump for 15 minutes during the day (4-6 times/day). Give baby back any amount of EBM she receives. Keep OP follow up for Friday, 06/08/13 at 2:30.   Maternal    Feeding Feeding Type: Formula Length of feed: 15 min  LATCH Score/Interventions Latch: Repeated attempts needed to sustain latch, nipple held in mouth throughout feeding, stimulation needed to elicit sucking reflex. (using #24 nipple shield) Intervention(s): Adjust position;Assist  with latch;Breast massage;Breast compression  Audible Swallowing: Spontaneous and intermittent (w/supplement)  Type of Nipple: Flat (left thick, tough tissue, not compressible) Intervention(s): Double electric pump  Comfort (Breast/Nipple): Soft / non-tender     Hold (Positioning): Assistance needed to correctly position infant at breast and maintain latch. Intervention(s): Breastfeeding basics reviewed;Support Pillows;Position options;Skin to skin  LATCH Score: 7  Lactation Tools Discussed/Used Tools: Pump;Nipple Dorris CarnesShields;Supplemental Nutrition System;90F feeding tube / Syringe Nipple shield size: 20;24 Breast pump type: Double-Electric Breast Pump   Consult Status Consult Status: Complete Date: 06/01/13 Follow-up type: In-patient    Alfred LevinsGranger, Mackenzie Groom Ann 06/01/2013, 2:43 PM

## 2013-06-01 NOTE — Lactation Note (Signed)
This note was copied from the chart of Kaitlin Avila. Lactation Consultation Note  Patient Name: Kaitlin Cora DanielsMaria Chavez de Blas [id number]'s Date: 06/01/2013 Reason for consult: Follow-up assessment;Difficult latch Mom called for assist with 1 more feeding before d/c. Baby sleepy but with stimulation baby nursed off and on for 15 minutes on right breast using #24 nipple shield. SNS set up and ready, after the 15 minutes, opened SNS for baby to get supplement. Right nipple also is thick, tough, not very compressible. Advised Mom to continue previous plan with the exception that she should use the nipple shield on both breast. Try to keep baby actively nursing on 1st breast without SNS for 15 minutes, then supplement with SNS/nipple shield on 2nd breast according to guidelines. Alternate each feeding which breasts she uses SNS. Post pump for 15 minutes after each feeding except at night. Keep OP follow up, Support group Monday night or Tuesday morning.   Maternal Data    Feeding Feeding Type: Breast Fed  LATCH Score/Interventions Latch: Repeated attempts needed to sustain latch, nipple held in mouth throughout feeding, stimulation needed to elicit sucking reflex. (#24 nipple shield right breast) Intervention(s): Adjust position;Assist with latch;Breast massage;Breast compression  Audible Swallowing: Spontaneous and intermittent (with supplement via SNS)  Type of Nipple: Everted at rest and after stimulation  Comfort (Breast/Nipple): Soft / non-tender     Hold (Positioning): Assistance needed to correctly position infant at breast and maintain latch. Intervention(s): Breastfeeding basics reviewed;Support Pillows;Position options;Skin to skin  LATCH Score: 8  Lactation Tools Discussed/Used Tools: Nipple Dorris CarnesShields;Supplemental Nutrition System;62F feeding tube / Syringe;Pump Nipple shield size: 20;24 Breast pump type: Double-Electric Breast Pump   Consult Status Consult Status:  Complete Date: 06/01/13 Follow-up type: In-patient    Alfred LevinsGranger, Vilas Edgerly Ann 06/01/2013, 5:02 PM

## 2013-06-01 NOTE — Discharge Instructions (Signed)
Call if temperature greater than equal to 100.4, nothing per vagina for 4-6 weeks or severe nausea vomiting, increased incisional pain , drainage or redness in the incision site, no straining with bowel movements, showers no bath °

## 2013-06-01 NOTE — Discharge Summary (Signed)
Obstetric Discharge Summary Reason for Admission: onset of labor Prenatal Procedures: ultrasound Intrapartum Procedures: spontaneous vaginal delivery Postpartum Procedures: none Complications-Operative and Postpartum: 2nd  degree perineal laceration and vaginal laceration Hemoglobin  Date Value Range Status  05/31/2013 10.1* 12.0 - 15.0 g/dL Final     DELTA CHECK NOTED     REPEATED TO VERIFY     HCT  Date Value Range Status  05/31/2013 29.2* 36.0 - 46.0 % Final    Physical Exam:  General: alert, cooperative and no distress Lochia: appropriate Uterine Fundus: firm Incision: n/a DVT Evaluation: No evidence of DVT seen on physical exam.  Discharge Diagnoses: Term Pregnancy-delivered  Discharge Information: Date: 06/01/2013 Activity: pelvic rest Diet: routine Medications: PNV and Ibuprofen Condition: stable Instructions: refer to practice specific booklet Discharge to: home Follow-up Information   Follow up with Serita KyleOUSINS,Dorsey Authement A, MD.   Specialty:  Obstetrics and Gynecology   Contact information:   [address] [place] [id number] [phone]       Newborn Data: Live born female  Birth Weight: 6 lb 13.2 oz (3096 g) APGAR: 6, 9  Home with mother.  Levert Heslop A 06/01/2013, 8:16 AM

## 2013-06-01 NOTE — Progress Notes (Signed)
PPD2 SVD:   S:  Pt reports feeling well/ Tolerating po/ Voiding without problems/ No n/v/ Bleeding is moderate/ Pain controlled withprescription NSAID's including Motrin  Newborn info  Live female not latching on well   O:  A & O x 3 stable VS: Blood pressure 107/70, pulse 83, temperature 97.8 F (36.6 C), temperature source Oral, resp. rate 18, height 5' (1.524 m), weight 69.57 kg (153 lb 6 oz), last menstrual period 08/30/2012, SpO2 97.00%, unknown if currently breastfeeding.  LABS: No results found for this or any previous visit (from the past 24 hour(s)).  I&O: I/O last 3 completed shifts: In: -  Out: 350 [Blood:350]  Breast full nontender left nipple slight flat    Lungs:clear to A  Heart: regular rate and rhythm, S1, S2 normal, no murmur, click, rub or gallop  Abdomen: soft nontender uterus firm 2-3 FB below umb  Perineum: healing with good reapproximation  Lochia: mod  Extremities:no redness or tenderness in the calves or thighs, no edema    A/P: PPD # 2/ G1P1001  Doing well. Except issue with breastfeeding  Continue routine post partum orders  D/c home [facility] booklet given F/u 6 weeks Pt aware may have to change to baby room depending on issue of feeding

## 2013-06-08 ENCOUNTER — Ambulatory Visit (HOSPITAL_COMMUNITY): Payer: BC Managed Care – PPO

## 2014-02-07 ENCOUNTER — Encounter: Payer: BC Managed Care – PPO | Admitting: Women's Health

## 2014-02-13 ENCOUNTER — Ambulatory Visit (INDEPENDENT_AMBULATORY_CARE_PROVIDER_SITE_OTHER): Payer: BC Managed Care – PPO | Admitting: Women's Health

## 2014-02-13 ENCOUNTER — Other Ambulatory Visit (HOSPITAL_COMMUNITY)
Admission: RE | Admit: 2014-02-13 | Discharge: 2014-02-13 | Disposition: A | Discharge status: Home / Self Care | Payer: BC Managed Care – PPO | Source: Ambulatory Visit | Attending: Gynecology | Admitting: Gynecology

## 2014-02-13 ENCOUNTER — Encounter: Payer: Self-pay | Admitting: Women's Health

## 2014-02-13 VITALS — BP 114/70 | Ht 60.0 in | Wt 142.0 lb

## 2014-02-13 DIAGNOSIS — Z01419 Encounter for gynecological examination (general) (routine) without abnormal findings: Secondary | ICD-10-CM | POA: Insufficient documentation

## 2014-02-13 DIAGNOSIS — Z30018 Encounter for initial prescription of other contraceptives: Secondary | ICD-10-CM

## 2014-02-13 DIAGNOSIS — Z833 Family history of diabetes mellitus: Secondary | ICD-10-CM

## 2014-02-13 DIAGNOSIS — Z3009 Encounter for other general counseling and advice on contraception: Secondary | ICD-10-CM

## 2014-02-13 LAB — CBC WITH DIFFERENTIAL/PLATELET
Basophils Absolute: 0 10*3/uL (ref 0.0–0.1)
Basophils Relative: 0 % (ref 0–1)
Eosinophils Absolute: 0.1 10*3/uL (ref 0.0–0.7)
Eosinophils Relative: 1 % (ref 0–5)
HEMATOCRIT: 40.6 % (ref 36.0–46.0)
Hemoglobin: 14 g/dL (ref 12.0–15.0)
LYMPHS ABS: 3.1 10*3/uL (ref 0.7–4.0)
LYMPHS PCT: 45 % (ref 12–46)
MCH: 29.7 pg (ref 26.0–34.0)
MCHC: 34.5 g/dL (ref 30.0–36.0)
MCV: 86 fL (ref 78.0–100.0)
MONO ABS: 0.6 10*3/uL (ref 0.1–1.0)
Monocytes Relative: 9 % (ref 3–12)
NEUTROS ABS: 3.1 10*3/uL (ref 1.7–7.7)
NEUTROS PCT: 45 % (ref 43–77)
Platelets: 332 10*3/uL (ref 150–400)
RBC: 4.72 MIL/uL (ref 3.87–5.11)
RDW: 13.6 % (ref 11.5–15.5)
WBC: 6.9 10*3/uL (ref 4.0–10.5)

## 2014-02-13 LAB — GLUCOSE, RANDOM: Glucose, Bld: 79 mg/dL (ref 70–99)

## 2014-02-13 MED ORDER — NORELGESTROMIN-ETH ESTRADIOL 150-35 MCG/24HR TD PTWK: MEDICATED_PATCH | TRANSDERMAL | Status: DC

## 2014-02-13 NOTE — Progress Notes (Signed)
Kaitlin Avila September 12, 1980 [id number]    History:    Presents for annual exam.     Normal vaginal delivery w/o complications per Dr. Cherly Hensen -Daughter- 05/30/2013.  Breast fed for 4 mo.  LMP 01/30/2014.  Currently using OCP but desires information on other forms of contraception.  History of uterine/vaginal septum lysed by Dr. April Manson. Has double cervix.  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 Follow up Pap smears 2011, 12 and 2013  normal.      Past medical history, past surgical history, family history and social history were all reviewed and documented in the EPIC chart.  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  Father with diabetes mellitus, mother with hypertension and hypercholesteremia.      ROS:  A  12 point ROS was performed and pertinent positives and negatives are included.  Exam:  Filed Vitals:   02/13/14 1120  BP: 114/70    General appearance:  Normal Thyroid:  Symmetrical, normal in size, without palpable masses or nodularity. Respiratory  Auscultation:  Clear without wheezing or rhonchi Cardiovascular  Auscultation:  Regular rate, without rubs, murmurs or gallops  Edema/varicosities:  Not grossly evident Abdominal  Soft,nontender, without masses, guarding or rebound.  Liver/spleen:  No organomegaly noted  Hernia:  None appreciated  Skin  Inspection:  Grossly normal   Breasts: Examined lying and sitting.     Right: Without masses, retractions, discharge or axillary adenopathy.     Left: Without masses, retractions, discharge or axillary adenopathy. Gentitourinary   Inguinal/mons:  Normal without inguinal adenopathy  External genitalia:  Normal  BUS/Urethra/Skene's glands:  Normal  Vagina:  Normal  Cervix:  Normal- only one cervix observed  Uterus:  Normal in size, shape and contour.  Midline and mobile  Adnexa/parametria:     Rt: Without masses or tenderness.   Lt: Without masses or tenderness.  Anus and perineum: Normal  Digital rectal  exam: Normal sphincter tone without palpated masses or tenderness  Assessment/Plan:  33 y.o.  MHF G1P1 for annual exam, no complaints.     Contraception management Double cervix  Plan: Contraception options reviewed.  Ortho Evra, proper use and risks of blood clots and stroke reviewed, start after completing current pack of pills.   Random glucose, CBC, and UA, Pap pending.  Reviewed only noted 1 cervix with exam.      Note: This dictation was prepared with Dragon/digital dictation.  Any transcriptional errors that result are unintentional. Harrington Challenger [facility], 11:45 AM 02/13/2014

## 2014-02-13 NOTE — Patient Instructions (Signed)

## 2014-02-13 NOTE — Addendum Note (Signed)
Addended by: Richardson Chiquito on: 02/13/2014 01:02 PM   Modules accepted: Orders

## 2014-02-14 LAB — URINALYSIS W MICROSCOPIC + REFLEX CULTURE
BILIRUBIN URINE: NEGATIVE
Bacteria, UA: NONE SEEN
CRYSTALS: NONE SEEN
Casts: NONE SEEN
GLUCOSE, UA: NEGATIVE mg/dL
Hgb urine dipstick: NEGATIVE
Ketones, ur: NEGATIVE mg/dL
LEUKOCYTES UA: NEGATIVE
Nitrite: NEGATIVE
PH: 7.5 (ref 5.0–8.0)
PROTEIN: NEGATIVE mg/dL
SPECIFIC GRAVITY, URINE: 1.017 (ref 1.005–1.030)
Urobilinogen, UA: 0.2 mg/dL (ref 0.0–1.0)

## 2014-02-18 LAB — CYTOLOGY - PAP

## 2014-04-01 ENCOUNTER — Encounter: Payer: Self-pay | Admitting: Women's Health

## 2014-05-09 ENCOUNTER — Other Ambulatory Visit: Payer: Self-pay

## 2014-05-09 DIAGNOSIS — Z30018 Encounter for initial prescription of other contraceptives: Secondary | ICD-10-CM

## 2014-05-09 MED ORDER — NORELGESTROMIN-ETH ESTRADIOL 150-35 MCG/24HR TD PTWK: MEDICATED_PATCH | TRANSDERMAL | Status: DC

## 2014-09-27 ENCOUNTER — Emergency Department (HOSPITAL_COMMUNITY)
Admission: EM | Admit: 2014-09-27 | Discharge: 2014-09-27 | Disposition: A | Discharge status: Home / Self Care | Payer: No Typology Code available for payment source | Attending: Emergency Medicine | Admitting: Emergency Medicine

## 2014-09-27 ENCOUNTER — Encounter (HOSPITAL_COMMUNITY): Payer: Self-pay | Admitting: *Deleted

## 2014-09-27 ENCOUNTER — Emergency Department (HOSPITAL_COMMUNITY): Payer: No Typology Code available for payment source

## 2014-09-27 DIAGNOSIS — Z8679 Personal history of other diseases of the circulatory system: Secondary | ICD-10-CM | POA: Insufficient documentation

## 2014-09-27 DIAGNOSIS — S199XXA Unspecified injury of neck, initial encounter: Secondary | ICD-10-CM | POA: Insufficient documentation

## 2014-09-27 DIAGNOSIS — Y998 Other external cause status: Secondary | ICD-10-CM | POA: Insufficient documentation

## 2014-09-27 DIAGNOSIS — S8991XA Unspecified injury of right lower leg, initial encounter: Secondary | ICD-10-CM | POA: Insufficient documentation

## 2014-09-27 DIAGNOSIS — Q519 Congenital malformation of uterus and cervix, unspecified: Secondary | ICD-10-CM | POA: Insufficient documentation

## 2014-09-27 DIAGNOSIS — Y92410 Unspecified street and highway as the place of occurrence of the external cause: Secondary | ICD-10-CM | POA: Insufficient documentation

## 2014-09-27 DIAGNOSIS — Y9389 Activity, other specified: Secondary | ICD-10-CM | POA: Insufficient documentation

## 2014-09-27 DIAGNOSIS — S29001A Unspecified injury of muscle and tendon of front wall of thorax, initial encounter: Secondary | ICD-10-CM | POA: Insufficient documentation

## 2014-09-27 MED ORDER — IBUPROFEN 400 MG PO TABS
800.0000 mg | ORAL_TABLET | Freq: Once | ORAL | Status: AC
  Administered 2014-09-27: 800 mg via ORAL
  Filled 2014-09-27: qty 2

## 2014-09-27 NOTE — Discharge Instructions (Signed)
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
## 2014-09-27 NOTE — ED Notes (Signed)
NP at bedside.
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
## 2014-10-16 ENCOUNTER — Telehealth: Payer: Self-pay | Admitting: *Deleted

## 2014-10-16 NOTE — Telephone Encounter (Signed)
Pt aware you are out of the office) pt currently using ortho Evra patch would like switch pack to Previfem pills. States patch irritates skin too much and has switched to different areas and this still is a problem. Please advise

## 2014-10-19 NOTE — Telephone Encounter (Signed)
Ok to switch back to previfem.  Finish out current box of ortho evra, call if problems with change.  Call in refills thru next annual exam.

## 2014-10-22 MED ORDER — NORGESTIMATE-ETH ESTRADIOL 0.25-35 MG-MCG PO TABS: 1.0000 | ORAL_TABLET | Freq: Every day | ORAL | Status: DC

## 2014-10-22 NOTE — Telephone Encounter (Signed)
Left message for pt to call with pharmacy 

## 2014-10-22 NOTE — Telephone Encounter (Signed)
Pt called back Rx sent. 

## 2015-01-05 ENCOUNTER — Emergency Department (HOSPITAL_COMMUNITY): Payer: BLUE CROSS/BLUE SHIELD

## 2015-01-05 ENCOUNTER — Emergency Department (HOSPITAL_COMMUNITY)
Admission: EM | Admit: 2015-01-05 | Discharge: 2015-01-05 | Disposition: A | Discharge status: Home / Self Care | Payer: BLUE CROSS/BLUE SHIELD | Attending: Emergency Medicine | Admitting: Emergency Medicine

## 2015-01-05 ENCOUNTER — Encounter (HOSPITAL_COMMUNITY): Payer: Self-pay | Admitting: Emergency Medicine

## 2015-01-05 DIAGNOSIS — X58XXXA Exposure to other specified factors, initial encounter: Secondary | ICD-10-CM | POA: Insufficient documentation

## 2015-01-05 DIAGNOSIS — Y998 Other external cause status: Secondary | ICD-10-CM | POA: Diagnosis not present

## 2015-01-05 DIAGNOSIS — S8262XA Displaced fracture of lateral malleolus of left fibula, initial encounter for closed fracture: Secondary | ICD-10-CM | POA: Diagnosis not present

## 2015-01-05 DIAGNOSIS — Z87718 Personal history of other specified (corrected) congenital malformations of genitourinary system: Secondary | ICD-10-CM | POA: Insufficient documentation

## 2015-01-05 DIAGNOSIS — Y9289 Other specified places as the place of occurrence of the external cause: Secondary | ICD-10-CM | POA: Insufficient documentation

## 2015-01-05 DIAGNOSIS — S99912A Unspecified injury of left ankle, initial encounter: Secondary | ICD-10-CM | POA: Diagnosis present

## 2015-01-05 DIAGNOSIS — Y9301 Activity, walking, marching and hiking: Secondary | ICD-10-CM | POA: Insufficient documentation

## 2015-01-05 DIAGNOSIS — S82892A Other fracture of left lower leg, initial encounter for closed fracture: Secondary | ICD-10-CM

## 2015-01-05 DIAGNOSIS — Z79818 Long term (current) use of other agents affecting estrogen receptors and estrogen levels: Secondary | ICD-10-CM | POA: Insufficient documentation

## 2015-01-05 DIAGNOSIS — Z8679 Personal history of other diseases of the circulatory system: Secondary | ICD-10-CM | POA: Diagnosis not present

## 2015-01-05 MED ORDER — TRAMADOL HCL 50 MG PO TABS: 50.0000 mg | ORAL_TABLET | Freq: Four times a day (QID) | ORAL | Status: DC | PRN

## 2015-01-05 MED ORDER — NAPROXEN 500 MG PO TABS: 500.0000 mg | ORAL_TABLET | Freq: Two times a day (BID) | ORAL | Status: DC

## 2015-01-05 NOTE — ED Notes (Signed)
Pt reported lt ankle injury s/p twisting ankle and hearing a "cracking" noise. (+)PMS, CRT brisk, noted swelling, no bruising/deformity. Ice packed applied in triage.

## 2015-01-05 NOTE — ED Provider Notes (Signed)
CSN: [id number]     Arrival date & time 01/05/15  1731 History  This chart was scribed for non-physician practitioner Jaynie Crumble, PA-C working with Lavera Guise, MD by Leone Payor, ED Scribe. This patient was seen in room WTR5/WTR5 and the patient's care was started at 6:20 PM.    Chief Complaint  Patient presents with  . Ankle Pain   The history is provided by the patient. No language interpreter was used.     HPI Comments: Kaitlin Avila is a 34 y.o. female who presents to the Emergency Department complaining of a left ankle injury that occurred 1.5 hours ago. Patient states she was walking off a playground set when she twisted the left ankle. She reports hearing a "popping" noise upon injury. She has associated swelling and pain to the left ankle which is aggravated with bearing weight and movement. She denies left foot pain or left knee pain. She denies numbness, weakness. She denies history of similar injury to the affected area.    Past Medical History  Diagnosis Date  . Migraines   . LSIL (low grade squamous intraepithelial lesion) on Pap smear     RIGHT - 10/10... ASCUS LEFT - NEGATIVE HR HPV 07/2009  . Congenital uterine anomaly     septated uterus, double cervices, longitudinal vaginal septum   Past Surgical History  Procedure Laterality Date  . Hysteroscopy  10/23/2009    EXCISION OF VAGINAL and UTERINE SEPTUM.    Family History  Problem Relation Age of Onset  . Hypertension Mother   . Hypertension Sister   . Diabetes Paternal Grandmother    History  Substance Use Topics  . Smoking status: Never Smoker   . Smokeless tobacco: Never Used  . Alcohol Use: Yes     Comment: seldom   OB History    Gravida Para Term Preterm AB TAB SAB Ectopic Multiple Living   Review of Systems  Musculoskeletal: Positive for myalgias, joint swelling and arthralgias (left ankle).  Skin: Negative for wound.  Neurological: Negative for weakness and  numbness.    Allergies  Review of patient's allergies indicates no known allergies.  Home Medications   Prior to Admission medications   Medication Sig Start Date End Date Taking? Authorizing Provider  norelgestromin-ethinyl estradiol (ORTHO EVRA) 150-35 MCG/24HR transdermal patch 1 patch/week for 3 weeks, no patch for 1 week 05/09/14   Harrington Challenger, NP  norgestimate-ethinyl estradiol (ORTHO-CYCLEN,SPRINTEC,PREVIFEM) 0.25-35 MG-MCG tablet Take 1 tablet by mouth daily. 10/22/14   Harrington Challenger, NP   BP 136/64 mmHg  Pulse 83  Temp(Src) 98.1 F (36.7 C) (Oral)  Resp 18  Ht 5' (1.524 m)  Wt 145 lb (65.772 kg)  BMI 28.32 kg/m2  SpO2 100%  LMP 01/01/2015 Physical Exam  Constitutional: She is oriented to person, place, and time. She appears well-developed and well-nourished.  HENT:  Head: Normocephalic and atraumatic.  Cardiovascular: Normal rate.   Pulmonary/Chest: Effort normal.  Abdominal: She exhibits no distension.  Musculoskeletal:  Left lateral malleolus swelling. Tender to palpation over lateral malleolus. Achilles tendon is intact. Normal knee and foot.Pain with range of motion of the ankle. Pain is mainly with plantarflexion and inversion.Dorsal pedal pulse intact. Cap refill <2 sec distally.  Neurological: She is alert and oriented to person, place, and time.  Skin: Skin is warm and dry.  Psychiatric: She has a normal mood and affect.  Nursing note and vitals reviewed.   ED Course  Procedures (including critical care time)  DIAGNOSTIC STUDIES: Oxygen Saturation is 100% on RA, normal by my interpretation.    COORDINATION OF CARE: 6:23 PM Will order XRAY of the left ankle. Discussed treatment plan with pt at bedside and pt agreed to plan.   Labs Review Labs Reviewed - No data to display  Imaging Review Dg Ankle Complete Left  01/05/2015   CLINICAL DATA:  Pain following twisting injury  EXAM: LEFT ANKLE COMPLETE - 3+ VIEW  COMPARISON:  None.  FINDINGS: Frontal,  oblique, and lateral views were obtained. There is marked soft tissue swelling laterally. There is a small avulsion arising from the lateral malleolus. There is no other demonstrable fracture. No joint effusion. The ankle mortise appears intact.  IMPRESSION: Avulsion arising from lateral malleolus with marked swelling laterally. Ankle mortise appears intact.   Electronically Signed   By: Bretta Bang III M.D.   On: 01/05/2015 18:20     EKG Interpretation None      MDM   Final diagnoses:  Avulsion fracture of ankle, left, closed, initial encounter   Patient with left ankle injury.X-ray showing possible small avulsion fracture, will placed in cam walker, crutches, ice and elevate at home. Naproxen and tramadol for pain. Follow-up with orthopedic specialist.  Filed Vitals:   01/05/15 1742  BP: 136/64  Pulse: 83  Temp: 98.1 F (36.7 C)  TempSrc: Oral  Resp: 18  Height: 5' (1.524 m)  Weight: 145 lb (65.772 kg)  SpO2: 100%    I personally performed the services described in this documentation, which was scribed in my presence. The recorded information has been reviewed and is accurate.   Jaynie Crumble, PA-C 01/05/15 1851  Lavera Guise, MD 01/06/15 [phone]

## 2015-01-05 NOTE — Discharge Instructions (Signed)
Naprosyn for pain and inflammation. Tramadol for severe pain. Ice and elevate left ankle. Stay off of it for several days. Follow up with orthopedics specialist.    Ankle Fracture A fracture is a break in a bone. The ankle joint is made up of three bones. These include the lower (distal)sections of your lower leg bones, called the tibia and fibula, along with a bone in your foot, called the talus. Depending on how bad the break is and if more than one ankle joint bone is broken, a cast or splint is used to protect and keep your injured bone from moving while it heals. Sometimes, surgery is required to help the fracture heal properly.  There are two general types of fractures:  Stable fracture. This includes a single fracture line through one bone, with no injury to ankle ligaments. A fracture of the talus that does not have any displacement (movement of the bone on either side of the fracture line) is also stable.  Unstable fracture. This includes more than one fracture line through one or more bones in the ankle joint. It also includes fractures that have displacement of the bone on either side of the fracture line. CAUSES  A direct blow to the ankle.   Quickly and severely twisting your ankle.  Trauma, such as a car accident or falling from a significant height. RISK FACTORS You may be at a higher risk of ankle fracture if:  You have certain medical conditions.  You are involved in high-impact sports.  You are involved in a high-impact car accident. SIGNS AND SYMPTOMS   Tender and swollen ankle.  Bruising around the injured ankle.  Pain on movement of the ankle.  Difficulty walking or putting weight on the ankle.  A cold foot below the site of the ankle injury. This can occur if the blood vessels passing through your injured ankle were also damaged.  Numbness in the foot below the site of the ankle injury. DIAGNOSIS  An ankle fracture is usually diagnosed with a physical  exam and X-rays. A CT scan may also be required for complex fractures. TREATMENT  Stable fractures are treated with a cast or splint and using crutches to avoid putting weight on your injured ankle. This is followed by an ankle strengthening program. Some patients require a special type of cast, depending on other medical problems they may have. Unstable fractures require surgery to ensure the bones heal properly. Your health care provider will tell you what type of fracture you have and the best treatment for your condition. HOME CARE INSTRUCTIONS   Review correct crutch use with your health care provider and use your crutches as directed. Safe use of crutches is extremely important. Misuse of crutches can cause you to fall or cause injury to nerves in your hands or armpits.  Do not put weight or pressure on the injured ankle until directed by your health care provider.  To lessen the swelling, keep the injured leg elevated while sitting or lying down.  Apply ice to the injured area:  Put ice in a plastic bag.  Place a towel between your cast and the bag.  Leave the ice on for 20 minutes, 2-3 times a day.  If you have a plaster or fiberglass cast:  Do not try to scratch the skin under the cast with any objects. This can increase your risk of skin infection.  Check the skin around the cast every day. You may put lotion on any red  or sore areas.  Keep your cast dry and clean.  If you have a plaster splint:  Wear the splint as directed.  You may loosen the elastic around the splint if your toes become numb, tingle, or turn cold or blue.  Do not put pressure on any part of your cast or splint; it may break. Rest your cast only on a pillow the first 24 hours until it is fully hardened.  Your cast or splint can be protected during bathing with a plastic bag sealed to your skin with medical tape. Do not lower the cast or splint into water.  Take medicines as directed by your health  care provider. Only take over-the-counter or prescription medicines for pain, discomfort, or fever as directed by your health care provider.  Do not drive a vehicle until your health care provider specifically tells you it is safe to do so.  If your health care provider has given you a follow-up appointment, it is very important to keep that appointment. Not keeping the appointment could result in a chronic or permanent injury, pain, and disability. If you have any problem keeping the appointment, call the facility for assistance. SEEK MEDICAL CARE IF: You develop increased swelling or discomfort. SEEK IMMEDIATE MEDICAL CARE IF:   Your cast gets damaged or breaks.  You have continued severe pain.  You develop new pain or swelling after the cast was put on.  Your skin or toenails below the injury turn blue or gray.  Your skin or toenails below the injury feel cold, numb, or have loss of sensitivity to touch.  There is a bad smell or pus draining from under the cast. MAKE SURE YOU:   Understand these instructions.  Will watch your condition.  Will get help right away if you are not doing well or get worse. Document Released: 05/14/2000 Document Revised: 05/22/2013 Document Reviewed: 12/14/2012 [facility] Patient Information 2015 [place], [place]. This information is not intended to replace advice given to you by your health care provider. Make sure you discuss any questions you have with your health care provider.

## 2015-02-18 ENCOUNTER — Encounter: Payer: BC Managed Care – PPO | Admitting: Gynecology

## 2015-02-19 ENCOUNTER — Ambulatory Visit (INDEPENDENT_AMBULATORY_CARE_PROVIDER_SITE_OTHER): Payer: BLUE CROSS/BLUE SHIELD | Admitting: Gynecology

## 2015-02-19 ENCOUNTER — Other Ambulatory Visit (HOSPITAL_COMMUNITY)
Admission: RE | Admit: 2015-02-19 | Discharge: 2015-02-19 | Disposition: A | Discharge status: Home / Self Care | Payer: BLUE CROSS/BLUE SHIELD | Source: Ambulatory Visit | Attending: Gynecology | Admitting: Gynecology

## 2015-02-19 ENCOUNTER — Encounter: Payer: Self-pay | Admitting: Gynecology

## 2015-02-19 VITALS — BP 116/68 | Ht 60.0 in | Wt 145.0 lb

## 2015-02-19 DIAGNOSIS — Z01419 Encounter for gynecological examination (general) (routine) without abnormal findings: Secondary | ICD-10-CM | POA: Diagnosis not present

## 2015-02-19 LAB — COMPREHENSIVE METABOLIC PANEL
ALT: 26 U/L (ref 6–29)
AST: 22 U/L (ref 10–30)
Albumin: 4.1 g/dL (ref 3.6–5.1)
Alkaline Phosphatase: 81 U/L (ref 33–115)
BUN: 6 mg/dL — AB (ref 7–25)
CHLORIDE: 104 mmol/L (ref 98–110)
CO2: 24 mmol/L (ref 20–31)
Calcium: 9.1 mg/dL (ref 8.6–10.2)
Creat: 0.58 mg/dL (ref 0.50–1.10)
Glucose, Bld: 74 mg/dL (ref 65–99)
POTASSIUM: 4.6 mmol/L (ref 3.5–5.3)
Sodium: 138 mmol/L (ref 135–146)
TOTAL PROTEIN: 7 g/dL (ref 6.1–8.1)
Total Bilirubin: 0.8 mg/dL (ref 0.2–1.2)

## 2015-02-19 LAB — CBC WITH DIFFERENTIAL/PLATELET
Basophils Absolute: 0 10*3/uL (ref 0.0–0.1)
Basophils Relative: 0 % (ref 0–1)
EOS PCT: 2 % (ref 0–5)
Eosinophils Absolute: 0.1 10*3/uL (ref 0.0–0.7)
HCT: 42.4 % (ref 36.0–46.0)
Hemoglobin: 14 g/dL (ref 12.0–15.0)
LYMPHS ABS: 2.7 10*3/uL (ref 0.7–4.0)
Lymphocytes Relative: 37 % (ref 12–46)
MCH: 30.4 pg (ref 26.0–34.0)
MCHC: 33 g/dL (ref 30.0–36.0)
MCV: 92 fL (ref 78.0–100.0)
MONOS PCT: 10 % (ref 3–12)
MPV: 9.9 fL (ref 8.6–12.4)
Monocytes Absolute: 0.7 10*3/uL (ref 0.1–1.0)
Neutro Abs: 3.8 10*3/uL (ref 1.7–7.7)
Neutrophils Relative %: 51 % (ref 43–77)
Platelets: 319 10*3/uL (ref 150–400)
RBC: 4.61 MIL/uL (ref 3.87–5.11)
RDW: 13.8 % (ref 11.5–15.5)
WBC: 7.4 10*3/uL (ref 4.0–10.5)

## 2015-02-19 LAB — LIPID PANEL
CHOL/HDL RATIO: 4.1 ratio (ref <=5.0)
CHOLESTEROL: 178 mg/dL (ref 125–200)
HDL: 43 mg/dL — ABNORMAL LOW (ref >=46)
LDL CALC: 110 mg/dL (ref <130)
TRIGLYCERIDES: 123 mg/dL (ref <150)
VLDL: 25 mg/dL (ref <30)

## 2015-02-19 NOTE — Patient Instructions (Signed)

## 2015-02-19 NOTE — Progress Notes (Signed)
Kaitlin Avila 11-22-1980 [id number]        34 y.o.  G1P1001 for annual exam.  Doing well.  Past medical history,surgical history, problem list, medications, allergies, family history and social history were all reviewed and documented as reviewed in the EPIC chart.  ROS:  Performed with pertinent positives and negatives included in the history, assessment and plan.   Additional significant findings :  none   Exam: [facility] person Vitals:   02/19/15 0911  BP: 116/68  Height: 5' (1.524 m)  Weight: 145 lb (65.772 kg)   General appearance:  Normal affect, orientation and appearance. Skin: Grossly normal HEENT: Without gross lesions.  No cervical or supraclavicular adenopathy. Thyroid normal.  Lungs:  Clear without wheezing, rales or rhonchi Cardiac: RR, without RMG Abdominal:  Soft, nontender, without masses, guarding, rebound, organomegaly or hernia Breasts:  Examined lying and sitting without masses, retractions, discharge or axillary adenopathy. Pelvic:  Ext/BUS/vagina normal  Cervix with some scarring. Single external os visualized. No other os visualized or palpated  Uterus retroverted, normal size, shape and contour, midline and mobile nontender   Adnexa  Without masses or tenderness    Anus and perineum  Normal   Rectovaginal  Normal sphincter tone without palpated masses or tenderness.    Assessment/Plan:  34 y.o. G29P1001 female for annual exam with regular menses, no contraception.   1. Contraception. Patient stopped her oral contraceptives. Is anticipating pregnancy trial. Need to be on a prenatal vitamin now discussed. If decides against pregnancy will call and we will refill her control pills. If achieves pregnancy than she is going to follow up with the obstetrician who delivered her last child. 2. History of septated uterus, double cervix and longitudinal vaginal septum. Had resection of the septated uterus and vaginal septum. Underwent vaginal delivery and  now appears to only have single cervical os. Suspect that the cervical septum was torn during delivery.  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 Normal Pap smears since then. Pap smear done today. 3. Breast health. SBE monthly reviewed. 4. Health maintenance. Baseline CBC comprehensive metabolic panel lipid profile urinalysis. Follow up in one year if decides against pregnancy otherwise follow up with obstetrics once she obtains pregnancy.   [name] MD, 9:33 AM 02/19/2015

## 2015-02-19 NOTE — Addendum Note (Signed)
Addended by: Dayna Barker on: 02/19/2015 10:15 AM   Modules accepted: Orders

## 2015-02-20 LAB — URINALYSIS W MICROSCOPIC + REFLEX CULTURE
Bacteria, UA: NONE SEEN [HPF]
Bilirubin Urine: NEGATIVE
Casts: NONE SEEN [LPF]
Crystals: NONE SEEN [HPF]
Glucose, UA: NEGATIVE
Ketones, ur: NEGATIVE
Leukocytes, UA: NEGATIVE
NITRITE: NEGATIVE
Protein, ur: NEGATIVE
SPECIFIC GRAVITY, URINE: 1.024 (ref 1.001–1.035)
Yeast: NONE SEEN [HPF]
pH: 7.5 (ref 5.0–8.0)

## 2015-02-21 LAB — CYTOLOGY - PAP

## 2015-02-22 LAB — URINE CULTURE

## 2015-02-24 ENCOUNTER — Other Ambulatory Visit: Payer: Self-pay | Admitting: Gynecology

## 2015-02-24 MED ORDER — SULFAMETHOXAZOLE-TRIMETHOPRIM 800-160 MG PO TABS: 1.0000 | ORAL_TABLET | Freq: Two times a day (BID) | ORAL | Status: DC

## 2015-06-01 NOTE — L&D Delivery Note (Signed)
Delivery Note At 2:25 AM a viable and healthy female was delivered via Vaginal, Spontaneous Delivery (Presentation:ROA ; vtx ).  APGAR:9 , 9; weight  pnding.   Placenta status: intact, spontanoeus marginal cord insertion, .  Cord:  with the following complications: .  Cord pH: none  Anesthesia:  none Episiotomy: None Lacerations:  none Suture Repair: none Est. Blood Loss (mL): 200  Mom to postpartum.  Baby to Couplet care / Skin to Skin.  Kaitlin Avila A 01/21/2016, 2:44 AM

## 2015-07-16 LAB — OB RESULTS CONSOLE GC/CHLAMYDIA
CHLAMYDIA, DNA PROBE: NEGATIVE
GC PROBE AMP, GENITAL: NEGATIVE

## 2015-07-16 LAB — OB RESULTS CONSOLE ANTIBODY SCREEN: Antibody Screen: NEGATIVE

## 2015-07-16 LAB — OB RESULTS CONSOLE HEPATITIS B SURFACE ANTIGEN: HEP B S AG: NEGATIVE

## 2015-07-16 LAB — OB RESULTS CONSOLE HIV ANTIBODY (ROUTINE TESTING): HIV: NONREACTIVE

## 2015-07-16 LAB — OB RESULTS CONSOLE RUBELLA ANTIBODY, IGM: RUBELLA: IMMUNE

## 2015-07-16 LAB — OB RESULTS CONSOLE RPR: RPR: NONREACTIVE

## 2015-07-16 LAB — OB RESULTS CONSOLE ABO/RH: RH Type: POSITIVE

## 2015-12-26 LAB — OB RESULTS CONSOLE GBS: GBS: NEGATIVE

## 2016-01-21 ENCOUNTER — Inpatient Hospital Stay (HOSPITAL_COMMUNITY)
Admission: AD | Admit: 2016-01-21 | Discharge: 2016-01-22 | DRG: 775 | Disposition: A | Discharge status: Home / Self Care | Payer: BLUE CROSS/BLUE SHIELD | Source: Ambulatory Visit | Attending: Obstetrics and Gynecology | Admitting: Obstetrics and Gynecology

## 2016-01-21 ENCOUNTER — Encounter (HOSPITAL_COMMUNITY): Payer: Self-pay

## 2016-01-21 DIAGNOSIS — Z3A39 39 weeks gestation of pregnancy: Secondary | ICD-10-CM | POA: Diagnosis not present

## 2016-01-21 DIAGNOSIS — O3400 Maternal care for unspecified congenital malformation of uterus, unspecified trimester: Principal | ICD-10-CM | POA: Diagnosis present

## 2016-01-21 LAB — TYPE AND SCREEN
ABO/RH(D): O POS
ANTIBODY SCREEN: NEGATIVE

## 2016-01-21 LAB — CBC
HEMATOCRIT: 39 % (ref 36.0–46.0)
Hemoglobin: 13.9 g/dL (ref 12.0–15.0)
MCH: 31.2 pg (ref 26.0–34.0)
MCHC: 35.6 g/dL (ref 30.0–36.0)
MCV: 87.4 fL (ref 78.0–100.0)
PLATELETS: 267 10*3/uL (ref 150–400)
RBC: 4.46 MIL/uL (ref 3.87–5.11)
RDW: 13.2 % (ref 11.5–15.5)
WBC: 12.4 10*3/uL — AB (ref 4.0–10.5)

## 2016-01-21 LAB — RPR: RPR Ser Ql: NONREACTIVE

## 2016-01-21 MED ORDER — LACTATED RINGERS IV SOLN: INTRAVENOUS | Status: DC

## 2016-01-21 MED ORDER — OXYTOCIN 40 UNITS IN LACTATED RINGERS INFUSION - SIMPLE MED: 2.5000 [IU]/h | INTRAVENOUS | Status: DC

## 2016-01-21 MED ORDER — ONDANSETRON HCL 4 MG/2ML IJ SOLN: 4.0000 mg | INTRAMUSCULAR | Status: DC | PRN

## 2016-01-21 MED ORDER — ONDANSETRON HCL 4 MG/2ML IJ SOLN: 4.0000 mg | Freq: Four times a day (QID) | INTRAMUSCULAR | Status: DC | PRN

## 2016-01-21 MED ORDER — OXYTOCIN 40 UNITS IN LACTATED RINGERS INFUSION - SIMPLE MED
INTRAVENOUS | Status: AC
  Filled 2016-01-21: qty 1000

## 2016-01-21 MED ORDER — LACTATED RINGERS IV SOLN: 500.0000 mL | Freq: Once | INTRAVENOUS | Status: DC

## 2016-01-21 MED ORDER — PHENYLEPHRINE 40 MCG/ML (10ML) SYRINGE FOR IV PUSH (FOR BLOOD PRESSURE SUPPORT)
80.0000 ug | PREFILLED_SYRINGE | INTRAVENOUS | Status: DC | PRN
  Filled 2016-01-21: qty 5

## 2016-01-21 MED ORDER — PHENYLEPHRINE 40 MCG/ML (10ML) SYRINGE FOR IV PUSH (FOR BLOOD PRESSURE SUPPORT)
PREFILLED_SYRINGE | INTRAVENOUS | Status: AC
  Filled 2016-01-21: qty 10

## 2016-01-21 MED ORDER — OXYCODONE-ACETAMINOPHEN 5-325 MG PO TABS: 1.0000 | ORAL_TABLET | ORAL | Status: DC | PRN

## 2016-01-21 MED ORDER — SIMETHICONE 80 MG PO CHEW: 80.0000 mg | CHEWABLE_TABLET | ORAL | Status: DC | PRN

## 2016-01-21 MED ORDER — IBUPROFEN 600 MG PO TABS
600.0000 mg | ORAL_TABLET | Freq: Four times a day (QID) | ORAL | Status: DC
  Administered 2016-01-21 – 2016-01-22 (×6): 600 mg via ORAL
  Filled 2016-01-21 (×6): qty 1

## 2016-01-21 MED ORDER — DIPHENHYDRAMINE HCL 50 MG/ML IJ SOLN: 12.5000 mg | INTRAMUSCULAR | Status: DC | PRN

## 2016-01-21 MED ORDER — PRENATAL MULTIVITAMIN CH
1.0000 | ORAL_TABLET | Freq: Every day | ORAL | Status: DC
  Administered 2016-01-21 – 2016-01-22 (×2): 1 via ORAL
  Filled 2016-01-21 (×2): qty 1

## 2016-01-21 MED ORDER — FENTANYL 2.5 MCG/ML BUPIVACAINE 1/10 % EPIDURAL INFUSION (WH - ANES)
14.0000 mL/h | INTRAMUSCULAR | Status: DC | PRN
Start: 2016-01-21 — End: 2016-01-21

## 2016-01-21 MED ORDER — ZOLPIDEM TARTRATE 5 MG PO TABS: 5.0000 mg | ORAL_TABLET | Freq: Every evening | ORAL | Status: DC | PRN

## 2016-01-21 MED ORDER — DIBUCAINE 1 % RE OINT: 1.0000 "application " | TOPICAL_OINTMENT | RECTAL | Status: DC | PRN

## 2016-01-21 MED ORDER — OXYTOCIN BOLUS FROM INFUSION: 500.0000 mL | Freq: Once | INTRAVENOUS | Status: DC

## 2016-01-21 MED ORDER — ACETAMINOPHEN 325 MG PO TABS: 650.0000 mg | ORAL_TABLET | ORAL | Status: DC | PRN

## 2016-01-21 MED ORDER — FLEET ENEMA 7-19 GM/118ML RE ENEM: 1.0000 | ENEMA | RECTAL | Status: DC | PRN

## 2016-01-21 MED ORDER — FENTANYL 2.5 MCG/ML BUPIVACAINE 1/10 % EPIDURAL INFUSION (WH - ANES)
INTRAMUSCULAR | Status: AC
  Filled 2016-01-21: qty 125

## 2016-01-21 MED ORDER — LIDOCAINE HCL (PF) 1 % IJ SOLN
30.0000 mL | INTRAMUSCULAR | Status: DC | PRN
  Filled 2016-01-21: qty 30

## 2016-01-21 MED ORDER — SENNOSIDES-DOCUSATE SODIUM 8.6-50 MG PO TABS
2.0000 | ORAL_TABLET | ORAL | Status: DC
  Administered 2016-01-22: 2 via ORAL
  Filled 2016-01-21: qty 2

## 2016-01-21 MED ORDER — ONDANSETRON HCL 4 MG PO TABS: 4.0000 mg | ORAL_TABLET | ORAL | Status: DC | PRN

## 2016-01-21 MED ORDER — LACTATED RINGERS IV SOLN: 500.0000 mL | INTRAVENOUS | Status: DC | PRN

## 2016-01-21 MED ORDER — WITCH HAZEL-GLYCERIN EX PADS: 1.0000 "application " | MEDICATED_PAD | CUTANEOUS | Status: DC | PRN

## 2016-01-21 MED ORDER — FERROUS SULFATE 325 (65 FE) MG PO TABS
325.0000 mg | ORAL_TABLET | Freq: Two times a day (BID) | ORAL | Status: DC
  Administered 2016-01-21 – 2016-01-22 (×3): 325 mg via ORAL
  Filled 2016-01-21 (×3): qty 1

## 2016-01-21 MED ORDER — SOD CITRATE-CITRIC ACID 500-334 MG/5ML PO SOLN: 30.0000 mL | ORAL | Status: DC | PRN

## 2016-01-21 MED ORDER — LIDOCAINE HCL (PF) 1 % IJ SOLN
INTRAMUSCULAR | Status: AC
  Filled 2016-01-21: qty 30

## 2016-01-21 MED ORDER — DIPHENHYDRAMINE HCL 25 MG PO CAPS: 25.0000 mg | ORAL_CAPSULE | Freq: Four times a day (QID) | ORAL | Status: DC | PRN

## 2016-01-21 MED ORDER — BENZOCAINE-MENTHOL 20-0.5 % EX AERO: 1.0000 "application " | INHALATION_SPRAY | CUTANEOUS | Status: DC | PRN

## 2016-01-21 MED ORDER — EPHEDRINE 5 MG/ML INJ
10.0000 mg | INTRAVENOUS | Status: DC | PRN
  Filled 2016-01-21: qty 4

## 2016-01-21 MED ORDER — COCONUT OIL OIL: 1.0000 "application " | TOPICAL_OIL | Status: DC | PRN

## 2016-01-21 MED ORDER — OXYCODONE-ACETAMINOPHEN 5-325 MG PO TABS: 2.0000 | ORAL_TABLET | ORAL | Status: DC | PRN

## 2016-01-21 NOTE — H&P (Signed)
Kaitlin DutyMaria G Chavez de Kaitlin Avila is a 35 y.o. female presenting in active labor. (-)GBS (+) intact membrane OB History    Gravida Para Term Preterm AB Living   2 1 1     1    SAB TAB Ectopic Multiple Live Births           1     Past Medical History:  Diagnosis Date  . Congenital uterine anomaly    septated uterus, double cervices, longitudinal vaginal septum  . LSIL (low grade squamous intraepithelial lesion) on Pap smear    RIGHT - 10/10... ASCUS LEFT - NEGATIVE HR HPV 07/2009  . Migraines    Past Surgical History:  Procedure Laterality Date  . HYSTEROSCOPY  10/23/2009   EXCISION OF VAGINAL and UTERINE SEPTUM.    Family History: family history includes Diabetes in her father and paternal grandmother; Hypertension in her mother and sister. Social History:  reports that she has never smoked. She has never used smokeless tobacco. She reports that she drinks alcohol. She reports that she does not use drugs.     Maternal Diabetes: No Genetic Screening: Declined Maternal Ultrasounds/Referrals: Normal Fetal Ultrasounds or other Referrals:  None Maternal Substance Abuse:  No Significant Maternal Medications:  None Significant Maternal Lab Results:  Lab values include: Group B Strep negative Other Comments:  None  ROS Maternal Medical History:  Reason for admission: Contractions.   Contractions: Frequency: regular.   Perceived severity is strong.    Fetal activity: Perceived fetal activity is normal.    Prenatal complications: no prenatal complications Prenatal Complications - Diabetes: none.    Dilation: 10 Effacement (%): 100 Station: -2 Exam by:: Rogelia Rohrerebecca McCall RN  Blood pressure 133/79, pulse 83, temperature 98.5 F (36.9 C), temperature source Oral, resp. rate 18, last menstrual period 02/19/2015, SpO2 100 %, unknown if currently breastfeeding. Exam Physical Exam  Constitutional: She is oriented to person, place, and time. She appears well-developed and well-nourished.   Neck: Neck supple.  Cardiovascular: Regular rhythm.   Respiratory: Breath sounds normal.  GI: Soft.  Musculoskeletal: She exhibits no edema.  Neurological: She is alert and oriented to person, place, and time.  Skin: Skin is warm and dry.    Prenatal labs: ABO, Rh: O/Positive/-- (02/15 0000) Antibody: Negative (02/15 0000) Rubella: Immune (02/15 0000) RPR: Nonreactive (02/15 0000)  HBsAg: Negative (02/15 0000)  HIV: Non-reactive (02/15 0000)  GBS: Negative (07/28 0000)   Assessment/Plan: Complete Term P) admit routine labs. Anticipate SVD   Kharson Rasmusson A 01/21/2016, 2:36 AM

## 2016-01-21 NOTE — Lactation Note (Signed)
This note was copied from a baby's chart. Lactation Consultation Note  Baby 9 hrs old and sleepy.  Mother has short shaft nipples, L shorter than R side. Mother states she bf and supplemented for 3 months and pumped then for 3 more months but had trouble w/ milk supply. She was able to pump with 1st child 4 oz. per session. Encouraged mother. Mother attempting to latch upon entering.  Attempted repositioning baby for more depth but mother at this time seems to prefer her own positioning. Provided her w/ hand pump and demonstrated how to prepump to help evert nipple. Discussed milk supply, supply and demand.  Encouraged her to hand express and give baby ebm on spoon if having difficulty latching.  Reviewed cluster feeding.  Mom encouraged to feed baby 8-12 times/24 hours and with feeding cues.  Mom made aware of O/P services, breastfeeding support groups, community resources, and our phone # for post-discharge questions.     Patient Name: [name] Today's Date: 01/21/2016 Reason for consult: Initial assessment   Maternal Data Formula Feeding for Exclusion: No Has patient been taught Hand Expression?: Yes Does the patient have breastfeeding experience prior to this delivery?: Yes  Feeding Feeding Type: Breast Fed Length of feed: 10 min  LATCH Score/Interventions Latch: Repeated attempts needed to sustain latch, nipple held in mouth throughout feeding, stimulation needed to elicit sucking reflex. Intervention(s): Adjust position;Assist with latch;Breast massage;Breast compression  Audible Swallowing: A few with stimulation  Type of Nipple: Everted at rest and after stimulation  Comfort (Breast/Nipple): Soft / non-tender     Hold (Positioning): Assistance needed to correctly position infant at breast and maintain latch.  LATCH Score: 7  Lactation Tools Discussed/Used     Consult Status Consult Status: Follow-up Date: 01/22/16 Follow-up type:  In-patient    Dahlia ByesBerkelhammer, Ruth [facility] 01/21/2016, 11:54 AM

## 2016-01-21 NOTE — MAU Note (Signed)
Contractions. Denies LOF or vag bleeding. +FM. Cervix was 1cm at last exam.

## 2016-01-22 LAB — CBC
HCT: 36.7 % (ref 36.0–46.0)
Hemoglobin: 12.6 g/dL (ref 12.0–15.0)
MCH: 31 pg (ref 26.0–34.0)
MCHC: 34.3 g/dL (ref 30.0–36.0)
MCV: 90.2 fL (ref 78.0–100.0)
PLATELETS: 239 10*3/uL (ref 150–400)
RBC: 4.07 MIL/uL (ref 3.87–5.11)
RDW: 13.7 % (ref 11.5–15.5)
WBC: 13 10*3/uL — AB (ref 4.0–10.5)

## 2016-01-22 MED ORDER — BENZOCAINE-MENTHOL 20-0.5 % EX AERO: 1.0000 "application " | INHALATION_SPRAY | CUTANEOUS | Status: DC | PRN

## 2016-01-22 MED ORDER — COCONUT OIL OIL: 1.0000 "application " | TOPICAL_OIL | 0 refills | Status: DC | PRN

## 2016-01-22 MED ORDER — IBUPROFEN 600 MG PO TABS
600.0000 mg | ORAL_TABLET | Freq: Four times a day (QID) | ORAL | 0 refills | Status: DC
Start: 2016-01-22 — End: 2017-02-10

## 2016-01-22 NOTE — Lactation Note (Signed)
This note was copied from a baby's chart. Lactation Consultation Note;   Multiple attempts to latch infant. Infant suckles a few suck then off the breast. Mother was fit with a #20 nipple shield. Infant latched but poorly.  Infant was given alementium via the curved tip syringe at the breast. Infant latched much deeper with the shield. Infant was given a total of 20 ml of formula.  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 Mother to follow up with Peds tomorrow  and Lactation as needed.  Patient Name: Girl Cora DanielsMaria Chavez de Blas ZOXWR'UToday's Date: 01/22/2016     Maternal Data    Feeding Feeding Type: Formula Length of feed: 60 min  LATCH Score/Interventions                      Lactation Tools Discussed/Used     Consult Status      Michel BickersKendrick, Ifeoma Vallin McCoy 01/22/2016, 1:50 PM

## 2016-01-22 NOTE — Progress Notes (Signed)
PPD # 1 SVD Information for the patient's newborn:  Kaitlin Avila, Girl Byrd HesselbachMaria [[id number]][[id number]]  female    breast feeding   S:  Reports feeling well, desires DC home today.             Tolerating po/ No nausea or vomiting             Bleeding is light             Pain controlled with ibuprofen             Up ad lib / ambulatory / voiding without difficulties        O:  A & O x 3, in no apparent distress              VS:  Vitals:   01/21/16 0524 01/21/16 0900 01/22/16 0024 01/22/16 0617  BP: 119/65 (!) 113/51 115/67 111/60  Pulse: 86 76 78 67  Resp: 18 19 18 18   Temp: 98.6 F (37 C) 98.7 F (37.1 C) 98.5 F (36.9 C) 98 F (36.7 C)  TempSrc: Oral Oral Oral Oral  SpO2:      Weight:      Height:        LABS:  Recent Labs  01/21/16 0155 01/22/16 0605  WBC 12.4* 13.0*  HGB 13.9 12.6  HCT 39.0 36.7  PLT 267 239    Blood type: --/--/O POS (08/23 0155)  Rubella: Immune (02/15 0000)   I&O: I/O last 3 completed shifts: In: -  Out: 200 [Blood:200]          No intake/output data recorded.  Lungs: Clear and unlabored  Heart: regular rate and rhythm / no murmurs  Abdomen: soft, non-tender, non-distended             Fundus: firm, non-tender, U-@  Perineum: intact, no edema  Lochia: scant  Extremities: no edema, no calf pain or tenderness    A/P: PPD # 1 34 y.o., [id number]   Principal Problem:   Postpartum care following vaginal delivery(8/23)   Doing well - stable status  Routine post partum orders  DC home today    Neta Mendsaniela C Twanisha Foulk, MSN, CNM 01/22/2016, 8:17 AM

## 2016-01-22 NOTE — Lactation Note (Signed)
This note was copied from a baby's chart. Lactation Consultation Note Mother states that infant just fed on and off for 50 mins. Mother states that she falls asleep after a few mins. Mother advised to page for next feeding to be assessed.  Mother advised to do good breast massage and use ice to prevent severe engorgement. Mother advised to feed infant with feeding cue and allow for cluster feeding. Mother is aware of available LC services.   Patient Name: Kaitlin Cora DanielsMaria Chavez de Avila ZOXWR'UToday's Date: 01/22/2016     Maternal Data    Feeding Feeding Type: Formula Length of feed: 60 min  LATCH Score/Interventions                      Lactation Tools Discussed/Used     Consult Status      Michel BickersKendrick, Tawsha Terrero McCoy 01/22/2016, 12:13 PM

## 2016-01-22 NOTE — Discharge Summary (Signed)
Obstetric Discharge Summary Reason for Admission: onset of labor Prenatal Procedures: ultrasound Intrapartum Procedures: spontaneous vaginal delivery Postpartum Procedures: none Complications-Operative and Postpartum: none Hemoglobin  Date Value Ref Range Status  01/22/2016 12.6 12.0 - 15.0 g/dL Final   HCT  Date Value Ref Range Status  01/22/2016 36.7 36.0 - 46.0 % Final    Physical Exam:  General: alert, cooperative and no distress Lochia: appropriate Uterine Fundus: firm Incision: NA DVT Evaluation: No evidence of DVT seen on physical exam. No significant calf/ankle edema.  Discharge Diagnoses: Term Pregnancy-delivered  Discharge Information: Date: 01/22/2016 Activity: pelvic rest Diet: routine Medications: PNV and Ibuprofen Condition: stable Instructions: refer to practice specific booklet Discharge to: home Follow-up Information    COUSINS,SHERONETTE A, MD. Schedule an appointment as soon as possible for a visit in 6 week(s).   Specialty:  Obstetrics and Gynecology Why:  Postpartum visit Contact information: [place] [address] [place] [address] [phone]           Newborn Data: Live born female  Birth Weight: 6 lb 9.1 oz (2980 g) APGAR: 9, 9  Home with mother.  Neta MendsDaniela C Kinzie Wickes, CNM 01/22/2016, 8:35 AM

## 2016-02-20 ENCOUNTER — Encounter: Payer: BLUE CROSS/BLUE SHIELD | Admitting: Gynecology
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
## 2017-01-03 DIAGNOSIS — J0100 Acute maxillary sinusitis, unspecified: Secondary | ICD-10-CM | POA: Diagnosis not present

## 2017-01-03 DIAGNOSIS — J069 Acute upper respiratory infection, unspecified: Secondary | ICD-10-CM | POA: Diagnosis not present

## 2017-02-07 ENCOUNTER — Encounter: Payer: BLUE CROSS/BLUE SHIELD | Admitting: Gynecology

## 2017-02-10 ENCOUNTER — Ambulatory Visit (INDEPENDENT_AMBULATORY_CARE_PROVIDER_SITE_OTHER): Payer: 59 | Admitting: Gynecology

## 2017-02-10 ENCOUNTER — Encounter: Payer: Self-pay | Admitting: Gynecology

## 2017-02-10 VITALS — BP 120/82 | Ht 60.0 in | Wt 163.0 lb

## 2017-02-10 DIAGNOSIS — Z01419 Encounter for gynecological examination (general) (routine) without abnormal findings: Secondary | ICD-10-CM | POA: Diagnosis not present

## 2017-02-10 DIAGNOSIS — Z13220 Encounter for screening for lipoid disorders: Secondary | ICD-10-CM

## 2017-02-10 NOTE — Progress Notes (Signed)
    Kaitlin Avila [birth year] [id number]        36 y.o.  [id number] for annual gynecologic exam.  Doing well.  Past medical history,surgical history, problem list, medications, allergies, family history and social history were all reviewed and documented as reviewed in the EPIC chart.  ROS:  Performed with pertinent positives and negatives included in the history, assessment and plan.   Additional significant findings :  None   Exam: Bari MantisKim Alexis assistant Vitals:   02/10/17 1627  BP: 120/82  Weight: 163 lb (73.9 kg)  Height: 5' (1.524 m)   Body mass index is 31.83 kg/m.  General appearance:  Normal affect, orientation and appearance. Skin: Grossly normal HEENT: Without gross lesions.  No cervical or supraclavicular adenopathy. Thyroid normal.  Lungs:  Clear without wheezing, rales or rhonchi Cardiac: RR, without RMG Abdominal:  Soft, nontender, without masses, guarding, rebound, organomegaly or hernia Breasts:  Examined lying and sitting without masses, retractions, discharge or axillary adenopathy. Pelvic:  Ext, BUS, Vagina: Normal  Cervix: With some scarring. Pap smear done  Uterus: Axial, normal size, shape and contour, midline and mobile nontender   Adnexa: Without masses or tenderness    Anus and perineum: Normal   Rectovaginal: Normal sphincter tone without palpated masses or tenderness.    Assessment/Plan:  36 y.o. 242P2002 female for annual gynecologic exam with regular menses, oral contraceptives.   1. Patient on oral contraceptives prescribed by her obstetrician at her postpartum appointment last year. She is unsure the name and she is going to call us back so that we can prescribe them for her for this coming year. She's doing well on them and wants to continue. 2. History of septated uterus, double cervix and longitudinal vaginal septum. She had the longitudinal septum and septated uterus resected. Double cervix apparently was resolved with her first delivery.  Exam shows some scarring but a single cervix with os. Pap smear done today. 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 Normal Pap smears since then. 3. Breast health. SBE monthly reviewed. We'll plan baseline mammogram at age 36. Breast exam normal today. 4. Health maintenance. Baseline CBC, CMP, lipid profile and urinalysis ordered. Follow up 1 year, sooner as needed.   Dara LordsFONTAINE,Jennaya Pogue P MD, 4:47 PM 02/10/2017

## 2017-02-10 NOTE — Patient Instructions (Signed)
Call back with the name of the birth control pill that you're taking.  Follow up in one year for annual exam

## 2017-02-11 LAB — PAP IG W/ RFLX HPV ASCU

## 2017-02-12 LAB — URINE CULTURE
MICRO NUMBER: 81017117
SPECIMEN QUALITY: ADEQUATE

## 2017-02-12 LAB — CULTURE INDICATED

## 2017-02-12 LAB — URINALYSIS W MICROSCOPIC + REFLEX CULTURE
BILIRUBIN URINE: NEGATIVE
GLUCOSE, UA: NEGATIVE
Hgb urine dipstick: NEGATIVE
Ketones, ur: NEGATIVE
NITRITES URINE, INITIAL: NEGATIVE
PROTEIN: NEGATIVE
RBC / HPF: NONE SEEN /HPF (ref 0–2)
SPECIFIC GRAVITY, URINE: 1.021 (ref 1.001–1.03)
pH: 7.5 (ref 5.0–8.0)

## 2017-02-14 ENCOUNTER — Encounter: Payer: Self-pay | Admitting: Gynecology

## 2017-02-14 ENCOUNTER — Other Ambulatory Visit: Payer: 59

## 2017-02-14 LAB — CBC WITH DIFFERENTIAL/PLATELET
BASOS ABS: 21 {cells}/uL (ref 0–200)
Basophils Relative: 0.3 %
EOS PCT: 1 %
Eosinophils Absolute: 69 cells/uL (ref 15–500)
HCT: 38.8 % (ref 35.0–45.0)
Hemoglobin: 13 g/dL (ref 11.7–15.5)
Lymphs Abs: 3319 cells/uL (ref 850–3900)
MCH: 28.1 pg (ref 27.0–33.0)
MCHC: 33.5 g/dL (ref 32.0–36.0)
MCV: 83.8 fL (ref 80.0–100.0)
MONOS PCT: 8 %
MPV: 10.6 fL (ref 7.5–12.5)
NEUTROS PCT: 42.6 %
Neutro Abs: 2939 cells/uL (ref 1500–7800)
PLATELETS: 336 10*3/uL (ref 140–400)
RBC: 4.63 10*6/uL (ref 3.80–5.10)
RDW: 15 % (ref 11.0–15.0)
Total Lymphocyte: 48.1 %
WBC mixed population: 552 cells/uL (ref 200–950)
WBC: 6.9 10*3/uL (ref 3.8–10.8)

## 2017-02-14 LAB — COMPREHENSIVE METABOLIC PANEL
AG Ratio: 1.4 (calc) (ref 1.0–2.5)
ALBUMIN MSPROF: 4 g/dL (ref 3.6–5.1)
ALKALINE PHOSPHATASE (APISO): 70 U/L (ref 33–115)
ALT: 33 U/L — AB (ref 6–29)
AST: 23 U/L (ref 10–30)
BUN: 9 mg/dL (ref 7–25)
CHLORIDE: 107 mmol/L (ref 98–110)
CO2: 23 mmol/L (ref 20–32)
CREATININE: 0.54 mg/dL (ref 0.50–1.10)
Calcium: 8.9 mg/dL (ref 8.6–10.2)
GLUCOSE: 89 mg/dL (ref 65–99)
Globulin: 2.8 g/dL (calc) (ref 1.9–3.7)
Potassium: 4.2 mmol/L (ref 3.5–5.3)
Sodium: 134 mmol/L — ABNORMAL LOW (ref 135–146)
TOTAL PROTEIN: 6.8 g/dL (ref 6.1–8.1)
Total Bilirubin: 0.5 mg/dL (ref 0.2–1.2)

## 2017-02-14 LAB — LIPID PANEL
CHOL/HDL RATIO: 5.1 (calc) — AB (ref <5.0)
Cholesterol: 204 mg/dL — ABNORMAL HIGH (ref <200)
HDL: 40 mg/dL — AB (ref >50)
LDL Cholesterol (Calc): 138 mg/dL (calc) — ABNORMAL HIGH
NON-HDL CHOLESTEROL (CALC): 164 mg/dL — AB (ref <130)
TRIGLYCERIDES: 131 mg/dL (ref <150)

## 2017-02-16 ENCOUNTER — Other Ambulatory Visit: Payer: Self-pay | Admitting: Gynecology

## 2017-02-16 DIAGNOSIS — E7800 Pure hypercholesterolemia, unspecified: Secondary | ICD-10-CM

## 2017-02-16 DIAGNOSIS — E786 Lipoprotein deficiency: Secondary | ICD-10-CM

## 2017-03-29 DIAGNOSIS — H33321 Round hole, right eye: Secondary | ICD-10-CM | POA: Diagnosis not present

## 2017-06-28 ENCOUNTER — Other Ambulatory Visit: Payer: Self-pay | Admitting: Gynecology

## 2017-06-28 NOTE — Telephone Encounter (Signed)
Per note on 02/10/17 "Patient on oral contraceptives prescribed by her obstetrician at her postpartum appointment last year. She is unsure the name and she is going to call us back so that we can prescribe them for her for this coming year. She's doing well on them and wants to continue."

## 2017-07-04 DIAGNOSIS — R05 Cough: Secondary | ICD-10-CM | POA: Diagnosis not present

## 2017-07-04 DIAGNOSIS — Z20828 Contact with and (suspected) exposure to other viral communicable diseases: Secondary | ICD-10-CM | POA: Diagnosis not present

## 2018-02-13 ENCOUNTER — Ambulatory Visit (INDEPENDENT_AMBULATORY_CARE_PROVIDER_SITE_OTHER): Payer: 59 | Admitting: Gynecology

## 2018-02-13 ENCOUNTER — Encounter: Payer: Self-pay | Admitting: Gynecology

## 2018-02-13 VITALS — BP 116/74 | Ht 60.0 in | Wt 151.0 lb

## 2018-02-13 DIAGNOSIS — Z13220 Encounter for screening for lipoid disorders: Secondary | ICD-10-CM | POA: Diagnosis not present

## 2018-02-13 DIAGNOSIS — Z01419 Encounter for gynecological examination (general) (routine) without abnormal findings: Secondary | ICD-10-CM | POA: Diagnosis not present

## 2018-02-13 MED ORDER — NORETHIN ACE-ETH ESTRAD-FE 1-20 MG-MCG PO TABS: 1.0000 | ORAL_TABLET | Freq: Every day | ORAL | 4 refills | Status: DC

## 2018-02-13 NOTE — Patient Instructions (Signed)
Follow-up in 1 year for annual exam, sooner if any issues. 

## 2018-02-13 NOTE — Progress Notes (Signed)
    Kaitlin Avila 02/23/1981 [id number]        37 y.o.  [id number] for annual gynecologic exam.  Also complaining of some bilateral nipple sensitivity since starting an exercise program.  No nipple discharge or any abnormalities on self breast exam.  Past medical history,surgical history, problem list, medications, allergies, family history and social history were all reviewed and documented as reviewed in the EPIC chart.  ROS:  Performed with pertinent positives and negatives included in the history, assessment and plan.   Additional significant findings : None   Exam: Kennon PortelaKim Gardner assistant Vitals:   02/13/18 0821  BP: 116/74  Weight: 151 lb (68.5 kg)  Height: 5' (1.524 m)   Body mass index is 29.49 kg/m.  General appearance:  Normal affect, orientation and appearance. Skin: Grossly normal HEENT: Without gross lesions.  No cervical or supraclavicular adenopathy. Thyroid normal.  Lungs:  Clear without wheezing, rales or rhonchi Cardiac: RR, without RMG Abdominal:  Soft, nontender, without masses, guarding, rebound, organomegaly or hernia Breasts:  Examined lying and sitting without masses, retractions, discharge or axillary adenopathy. Pelvic:  Ext, BUS, Vagina: Normal  Cervix: Mild scarring  Uterus: Axial, normal size, shape and contour, midline and mobile nontender   Adnexa: Without masses or tenderness    Anus and perineum: Normal   Rectovaginal: Normal sphincter tone without palpated masses or tenderness.    Assessment/Plan:  37 y.o. 532P2002 female for annual gynecologic exam with regular menses, oral contraceptives.   1. Oral contraceptives.  Doing well on Loestrin 1/20 equivalent.  Refill x1 year provided. 2. Bilateral nipple sensitivity since exercising.  Exam is normal.  I suspect with jarring activity this is leading to some rubbing and sensitivity of the nipples.  Recommended OTC hydrocortisone cream and better support bra. 3. Pap smear 2018.  No Pap smear  done today.  History of LGSIL 2010 with normal Pap smears since.  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  Cervical exam shows mild scarring but single cervix since her vaginal deliveries. 4. Health maintenance.  CBC, CMP, lipid profile ordered.  Follow-up 1 year, sooner as needed.   Dara Lordsimothy P Peony Barner MD, 8:57 AM 02/13/2018

## 2018-02-14 ENCOUNTER — Encounter: Payer: Self-pay | Admitting: Gynecology

## 2018-02-14 LAB — COMPREHENSIVE METABOLIC PANEL
AG RATIO: 1.7 (calc) (ref 1.0–2.5)
ALBUMIN MSPROF: 4.3 g/dL (ref 3.6–5.1)
ALT: 32 U/L — ABNORMAL HIGH (ref 6–29)
AST: 22 U/L (ref 10–30)
Alkaline phosphatase (APISO): 64 U/L (ref 33–115)
BILIRUBIN TOTAL: 1.1 mg/dL (ref 0.2–1.2)
BUN: 11 mg/dL (ref 7–25)
CO2: 26 mmol/L (ref 20–32)
Calcium: 9.3 mg/dL (ref 8.6–10.2)
Chloride: 106 mmol/L (ref 98–110)
Creat: 0.6 mg/dL (ref 0.50–1.10)
Globulin: 2.5 g/dL (calc) (ref 1.9–3.7)
Glucose, Bld: 85 mg/dL (ref 65–99)
POTASSIUM: 4.6 mmol/L (ref 3.5–5.3)
SODIUM: 139 mmol/L (ref 135–146)
TOTAL PROTEIN: 6.8 g/dL (ref 6.1–8.1)

## 2018-02-14 LAB — CBC WITH DIFFERENTIAL/PLATELET
BASOS ABS: 21 {cells}/uL (ref 0–200)
Basophils Relative: 0.3 %
EOS ABS: 98 {cells}/uL (ref 15–500)
Eosinophils Relative: 1.4 %
HCT: 41.3 % (ref 35.0–45.0)
Hemoglobin: 14 g/dL (ref 11.7–15.5)
Lymphs Abs: 2961 cells/uL (ref 850–3900)
MCH: 30.8 pg (ref 27.0–33.0)
MCHC: 33.9 g/dL (ref 32.0–36.0)
MCV: 91 fL (ref 80.0–100.0)
MONOS PCT: 9.9 %
MPV: 10.8 fL (ref 7.5–12.5)
NEUTROS PCT: 46.1 %
Neutro Abs: 3227 cells/uL (ref 1500–7800)
PLATELETS: 302 10*3/uL (ref 140–400)
RBC: 4.54 10*6/uL (ref 3.80–5.10)
RDW: 12.9 % (ref 11.0–15.0)
TOTAL LYMPHOCYTE: 42.3 %
WBC: 7 10*3/uL (ref 3.8–10.8)
WBCMIX: 693 {cells}/uL (ref 200–950)

## 2018-02-14 LAB — LIPID PANEL
Cholesterol: 199 mg/dL (ref <200)
HDL: 48 mg/dL — ABNORMAL LOW (ref >50)
LDL CHOLESTEROL (CALC): 131 mg/dL — AB
NON-HDL CHOLESTEROL (CALC): 151 mg/dL — AB (ref <130)
Total CHOL/HDL Ratio: 4.1 (calc) (ref <5.0)
Triglycerides: 94 mg/dL (ref <150)

## 2018-03-21 DIAGNOSIS — M7989 Other specified soft tissue disorders: Secondary | ICD-10-CM | POA: Diagnosis not present

## 2018-03-29 DIAGNOSIS — M79644 Pain in right finger(s): Secondary | ICD-10-CM | POA: Diagnosis not present

## 2018-03-29 DIAGNOSIS — H33321 Round hole, right eye: Secondary | ICD-10-CM | POA: Diagnosis not present

## 2018-03-29 DIAGNOSIS — L719 Rosacea, unspecified: Secondary | ICD-10-CM | POA: Diagnosis not present

## 2018-03-29 DIAGNOSIS — H40013 Open angle with borderline findings, low risk, bilateral: Secondary | ICD-10-CM | POA: Diagnosis not present

## 2018-08-05 ENCOUNTER — Other Ambulatory Visit: Payer: Self-pay | Admitting: Gynecology

## 2019-01-23 ENCOUNTER — Other Ambulatory Visit: Payer: Self-pay | Admitting: Gynecology

## 2019-02-14 ENCOUNTER — Other Ambulatory Visit: Payer: Self-pay

## 2019-02-15 ENCOUNTER — Ambulatory Visit (INDEPENDENT_AMBULATORY_CARE_PROVIDER_SITE_OTHER): Payer: 59 | Admitting: Gynecology

## 2019-02-15 ENCOUNTER — Encounter: Payer: Self-pay | Admitting: Gynecology

## 2019-02-15 VITALS — BP 118/74 | Ht 60.0 in | Wt 154.0 lb

## 2019-02-15 DIAGNOSIS — Z13220 Encounter for screening for lipoid disorders: Secondary | ICD-10-CM | POA: Diagnosis not present

## 2019-02-15 DIAGNOSIS — N941 Unspecified dyspareunia: Secondary | ICD-10-CM

## 2019-02-15 DIAGNOSIS — Z01419 Encounter for gynecological examination (general) (routine) without abnormal findings: Secondary | ICD-10-CM

## 2019-02-15 LAB — LIPID PANEL
Cholesterol: 201 mg/dL — ABNORMAL HIGH (ref <200)
HDL: 41 mg/dL — ABNORMAL LOW (ref >=50)
LDL Cholesterol (Calc): 135 mg/dL (calc) — ABNORMAL HIGH
Non-HDL Cholesterol (Calc): 160 mg/dL (calc) — ABNORMAL HIGH (ref <130)
Total CHOL/HDL Ratio: 4.9 (calc) (ref <5.0)
Triglycerides: 128 mg/dL (ref <150)

## 2019-02-15 LAB — COMPREHENSIVE METABOLIC PANEL
AG Ratio: 1.6 (calc) (ref 1.0–2.5)
ALT: 21 U/L (ref 6–29)
AST: 17 U/L (ref 10–30)
Albumin: 4.2 g/dL (ref 3.6–5.1)
Alkaline phosphatase (APISO): 64 U/L (ref 31–125)
BUN: 7 mg/dL (ref 7–25)
CO2: 27 mmol/L (ref 20–32)
Calcium: 9.4 mg/dL (ref 8.6–10.2)
Chloride: 106 mmol/L (ref 98–110)
Creat: 0.69 mg/dL (ref 0.50–1.10)
Globulin: 2.6 g/dL (calc) (ref 1.9–3.7)
Glucose, Bld: 90 mg/dL (ref 65–99)
Potassium: 4.4 mmol/L (ref 3.5–5.3)
Sodium: 136 mmol/L (ref 135–146)
Total Bilirubin: 1 mg/dL (ref 0.2–1.2)
Total Protein: 6.8 g/dL (ref 6.1–8.1)

## 2019-02-15 LAB — CBC WITH DIFFERENTIAL/PLATELET
Absolute Monocytes: 618 cells/uL (ref 200–950)
Basophils Absolute: 21 cells/uL (ref 0–200)
Basophils Relative: 0.3 %
Eosinophils Absolute: 57 cells/uL (ref 15–500)
Eosinophils Relative: 0.8 %
HCT: 43 % (ref 35.0–45.0)
Hemoglobin: 14.7 g/dL (ref 11.7–15.5)
Lymphs Abs: 2954 cells/uL (ref 850–3900)
MCH: 31.3 pg (ref 27.0–33.0)
MCHC: 34.2 g/dL (ref 32.0–36.0)
MCV: 91.7 fL (ref 80.0–100.0)
MPV: 10.5 fL (ref 7.5–12.5)
Monocytes Relative: 8.7 %
Neutro Abs: 3451 cells/uL (ref 1500–7800)
Neutrophils Relative %: 48.6 %
Platelets: 301 10*3/uL (ref 140–400)
RBC: 4.69 10*6/uL (ref 3.80–5.10)
RDW: 13 % (ref 11.0–15.0)
Total Lymphocyte: 41.6 %
WBC: 7.1 10*3/uL (ref 3.8–10.8)

## 2019-02-15 MED ORDER — NORETHIN ACE-ETH ESTRAD-FE 1-20 MG-MCG PO TABS: 1.0000 | ORAL_TABLET | Freq: Every day | ORAL | 4 refills | Status: DC

## 2019-02-15 MED ORDER — FLUCONAZOLE 150 MG PO TABS: 150.0000 mg | ORAL_TABLET | Freq: Once | ORAL | 0 refills | Status: AC

## 2019-02-15 NOTE — Progress Notes (Signed)
    [place] Jun 07, 1980 [id number]        38 y.o.  G2P2002 for annual gynecologic exam.  Notes that sometimes after intercourse she has some vaginal burning.  No discharge irritation or odor otherwise.  Also notes that her menses occur every other month despite pill free week each month.  She is on Loestrin 1/20 equivalent.  Past medical history,surgical history, problem list, medications, allergies, family history and social history were all reviewed and documented as reviewed in the EPIC chart.  ROS:  Performed with pertinent positives and negatives included in the history, assessment and plan.   Additional significant findings : None   Exam: Caryn Bee assistant Vitals:   02/15/19 0802  BP: 118/74  Weight: 154 lb (69.9 kg)  Height: 5' (1.524 m)   Body mass index is 30.08 kg/m.  General appearance:  Normal affect, orientation and appearance. Skin: Grossly normal HEENT: Without gross lesions.  No cervical or supraclavicular adenopathy. Thyroid normal.  Lungs:  Clear without wheezing, rales or rhonchi Cardiac: RR, without RMG Abdominal:  Soft, nontender, without masses, guarding, rebound, organomegaly or hernia Breasts:  Examined lying and sitting without masses, retractions, discharge or axillary adenopathy. Pelvic:  Ext, BUS, Vagina: Normal  Cervix: With scarring  Uterus: Axial, normal size, shape and contour, midline and mobile nontender   Adnexa: Without masses or tenderness    Anus and perineum: Normal   Rectovaginal: Normal sphincter tone without palpated masses or tenderness.    Assessment/Plan:  38 y.o. G81P2002 female for annual gynecologic exam.   1. Oral contraceptives.  On Loestrin 1/20 equivalent.  Skipping every other month despite having pill free week.  Discussed not unusual for low-dose oral contraceptives to skip bleeding.  Options reviewed to include increasing the estrogen component or continuing as is as long as she is comfortable she  consistently takes pills and pregnancy is not an issue.  At this point the patient wants to stay on the same pills and I refilled her Loestrin 1/20 equivalent x1 year. 2. 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  Cervix shows some scarring but single cervix since vaginal deliveries. 3. Burning after intercourse intermittently.  No evidence of infection today.  Recommended arbitrary Diflucan 150 mg x 1 dose in the event that she has a low level yeast that could contribute to this.  Also discussed OTC lubrication trial.  We will follow-up if continues to be an issue. 4. Pap smear 01/2017.  No Pap smear done today.  Plan repeat Pap smear next year at 3-year interval.  History of LGSIL 2010 with normal Pap smears since. 5. Health maintenance.  Requests baseline labs.  CBC, CMP and lipid profile ordered.  Follow-up 1 year, sooner as needed.   Anastasio Auerbach MD, 8:27 AM 02/15/2019

## 2019-02-15 NOTE — Patient Instructions (Signed)
Take the one Diflucan pill to see if it does not help with the vaginal irritation after intercourse.  Follow-up if this continues to be an issue.  Follow-up in 1 year for annual exam

## 2019-02-18 ENCOUNTER — Encounter: Payer: Self-pay | Admitting: Gynecology

## 2019-02-20 ENCOUNTER — Encounter: Payer: Self-pay | Admitting: Gynecology

## 2020-02-19 ENCOUNTER — Other Ambulatory Visit: Payer: Self-pay

## 2020-02-19 ENCOUNTER — Encounter: Payer: Self-pay | Admitting: Obstetrics and Gynecology

## 2020-02-19 ENCOUNTER — Ambulatory Visit (INDEPENDENT_AMBULATORY_CARE_PROVIDER_SITE_OTHER): Payer: 59 | Admitting: Obstetrics and Gynecology

## 2020-02-19 VITALS — BP 118/74 | Ht 60.0 in | Wt 156.0 lb

## 2020-02-19 DIAGNOSIS — Z01419 Encounter for gynecological examination (general) (routine) without abnormal findings: Secondary | ICD-10-CM | POA: Diagnosis not present

## 2020-02-19 DIAGNOSIS — Z1329 Encounter for screening for other suspected endocrine disorder: Secondary | ICD-10-CM | POA: Diagnosis not present

## 2020-02-19 DIAGNOSIS — Z124 Encounter for screening for malignant neoplasm of cervix: Secondary | ICD-10-CM

## 2020-02-19 DIAGNOSIS — Z13220 Encounter for screening for lipoid disorders: Secondary | ICD-10-CM

## 2020-02-19 MED ORDER — NORGESTIMATE-ETH ESTRADIOL 0.25-35 MG-MCG PO TABS: 1.0000 | ORAL_TABLET | Freq: Every day | ORAL | 4 refills | Status: DC

## 2020-02-19 NOTE — Progress Notes (Signed)
   Kaitlin Avila [place] 10-13-1980 [id number]  SUBJECTIVE:  39 y.o. G2P2002 female for annual routine gynecologic exam and Pap smear.  Monthly period but having a week or two of light flow to spotting that starts up shortly after her period stops.  Not currently having bleeding.  Also gets some cramping during that time.  Last year looks like she was having an issue with not having her period on the placebo week every month.   Current Outpatient Medications  Medication Sig Dispense Refill  . norethindrone-ethinyl estradiol (JUNEL FE 1/20) 1-20 MG-MCG tablet Take 1 tablet by mouth daily. 84 tablet 4   No current facility-administered medications for this visit.   Allergies: Patient has no known allergies.  Patient's last menstrual period was 01/22/2020.  Past medical history,surgical history, problem list, medications, allergies, family history and social history were all reviewed and documented as reviewed in the EPIC chart.  ROS: Pertinent positives and negatives have as reviewed above.   OBJECTIVE:  BP 118/74   Ht 5' (1.524 m)   Wt 156 lb (70.8 kg)   LMP 01/22/2020   BMI 30.47 kg/m  The patient appears well, alert, oriented x 3, in no distress. ENT normal.  Lungs are clear, good air entry, no wheezes, rhonchi or rales. S1 and S2 normal, no murmurs, regular rate and rhythm.  Abdomen soft without tenderness, guarding, mass or organomegaly.  Neurological is normal, no focal findings.  BREAST EXAM: breasts appear normal, no suspicious masses, no skin or nipple changes or axillary nodes  PELVIC EXAM: VULVA: normal appearing vulva with no masses, tenderness or lesions, VAGINA: normal appearing vagina with normal color and discharge, no lesions, CERVIX: normal appearing cervix with scar from prior septum resection, without discharge or lesions, UTERUS: uterus is normal size, shape, consistency and nontender, ADNEXA: normal adnexa in size, nontender and no masses, PAP: Pap smear done  today, thin-prep method  Chaperone: Fuller Song (DNP student) present during the examination and performed the pelvic exam with me in attendance to confirm the exam findings.  Kennon Portela present during the examination  ASSESSMENT:  39 y.o. [id number] here for annual gynecologic exam  PLAN:   1.  Abnormal uterine bleeding.  Breakthrough bleeding pattern as described above.  We discussed issues with ongoing use of low-dose OCP and might need to try slightly higher EE dose.  I recommended Ortho-Cyclen 0.25/35 and sent in a prescription for this.  May take a few months to see bleeding pattern changes.  We discussed theoretical risks of estrogen in higher doses to include thrombotic disease risk but Ortho-Cyclen is a standard modern low-dose OCP. 2. Pap smear 01/2017.  No significant history of abnormal Pap smears.  Pap smear collected today. 3. 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  Cervix shows some scarring but single cervix since vaginal deliveries. 4. Contraception.  Switching to Ortho-Cyclen as above, will notify us if any issues. 5. Breast exam normal.  SBE encouraged. 6. Health maintenance.  She will proceed to lab today for routine screening blood work (lipids, CBC, CMP, TSH).  Return annually or sooner, prn.  Theresia Majors MD 02/19/20

## 2020-02-19 NOTE — Addendum Note (Signed)
Addended by: Dayna Barker on: 02/19/2020 09:02 AM   Modules accepted: Orders

## 2020-02-20 LAB — COMPREHENSIVE METABOLIC PANEL
AG Ratio: 1.5 (calc) (ref 1.0–2.5)
ALT: 40 U/L — ABNORMAL HIGH (ref 6–29)
AST: 22 U/L (ref 10–30)
Albumin: 4.3 g/dL (ref 3.6–5.1)
Alkaline phosphatase (APISO): 71 U/L (ref 31–125)
BUN: 11 mg/dL (ref 7–25)
CO2: 25 mmol/L (ref 20–32)
Calcium: 9.7 mg/dL (ref 8.6–10.2)
Chloride: 105 mmol/L (ref 98–110)
Creat: 0.7 mg/dL (ref 0.50–1.10)
Globulin: 2.9 g/dL (calc) (ref 1.9–3.7)
Glucose, Bld: 95 mg/dL (ref 65–99)
Potassium: 4.5 mmol/L (ref 3.5–5.3)
Sodium: 140 mmol/L (ref 135–146)
Total Bilirubin: 1.5 mg/dL — ABNORMAL HIGH (ref 0.2–1.2)
Total Protein: 7.2 g/dL (ref 6.1–8.1)

## 2020-02-20 LAB — LIPID PANEL
Cholesterol: 205 mg/dL — ABNORMAL HIGH (ref <200)
HDL: 50 mg/dL (ref >=50)
LDL Cholesterol (Calc): 130 mg/dL (calc) — ABNORMAL HIGH
Non-HDL Cholesterol (Calc): 155 mg/dL (calc) — ABNORMAL HIGH (ref <130)
Total CHOL/HDL Ratio: 4.1 (calc) (ref <5.0)
Triglycerides: 136 mg/dL (ref <150)

## 2020-02-20 LAB — CBC
HCT: 42.4 % (ref 35.0–45.0)
Hemoglobin: 14.4 g/dL (ref 11.7–15.5)
MCH: 31.7 pg (ref 27.0–33.0)
MCHC: 34 g/dL (ref 32.0–36.0)
MCV: 93.4 fL (ref 80.0–100.0)
MPV: 10.9 fL (ref 7.5–12.5)
Platelets: 311 10*3/uL (ref 140–400)
RBC: 4.54 10*6/uL (ref 3.80–5.10)
RDW: 12.6 % (ref 11.0–15.0)
WBC: 6.5 10*3/uL (ref 3.8–10.8)

## 2020-02-20 LAB — TSH: TSH: 1.23 mIU/L

## 2020-02-21 ENCOUNTER — Other Ambulatory Visit: Payer: Self-pay

## 2020-02-21 DIAGNOSIS — R7989 Other specified abnormal findings of blood chemistry: Secondary | ICD-10-CM

## 2020-02-21 DIAGNOSIS — R17 Unspecified jaundice: Secondary | ICD-10-CM

## 2020-02-21 NOTE — Progress Notes (Signed)
Total bilirubin, thanks

## 2020-02-22 LAB — PAP IG W/ RFLX HPV ASCU

## 2020-02-22 LAB — HUMAN PAPILLOMAVIRUS, HIGH RISK: HPV DNA High Risk: NOT DETECTED

## 2020-03-11 ENCOUNTER — Other Ambulatory Visit: Payer: Self-pay | Admitting: Physician Assistant

## 2020-03-11 DIAGNOSIS — U071 COVID-19: Secondary | ICD-10-CM

## 2020-03-11 DIAGNOSIS — E663 Overweight: Secondary | ICD-10-CM

## 2020-03-11 NOTE — Progress Notes (Signed)
I connected by phone with Kaitlin Avila on 03/11/2020 at 12:04 PM to discuss the potential use of a new treatment for mild to moderate COVID-19 viral infection in non-hospitalized patients.  This patient is a 39 y.o. female that meets the FDA criteria for Emergency Use Authorization of COVID monoclonal antibody casirivimab/imdevimab or bamlanivimab/eteseviamb.  Has a (+) direct SARS-CoV-2 viral test result  Has mild or moderate COVID-19   Is NOT hospitalized due to COVID-19  Is within 10 days of symptom onset  Has at least one of the high risk factor(s) for progression to severe COVID-19 and/or hospitalization as defined in EUA.  Specific high risk criteria : BMI > 25 and Other high risk medical condition per CDC:  social vulnerability   I have spoken and communicated the following to the patient or parent/caregiver regarding COVID monoclonal antibody treatment:  1. FDA has authorized the emergency use for the treatment of mild to moderate COVID-19 in adults and pediatric patients with positive results of direct SARS-CoV-2 viral testing who are 24 years of age and older weighing at least 40 kg, and who are at high risk for progressing to severe COVID-19 and/or hospitalization.  2. The significant known and potential risks and benefits of COVID monoclonal antibody, and the extent to which such potential risks and benefits are unknown.  3. Information on available alternative treatments and the risks and benefits of those alternatives, including clinical trials.  4. Patients treated with COVID monoclonal antibody should continue to self-isolate and use infection control measures (e.g., wear mask, isolate, social distance, avoid sharing personal items, clean and disinfect "high touch" surfaces, and frequent handwashing) according to CDC guidelines.   5. The patient or parent/caregiver has the option to accept or refuse COVID monoclonal antibody treatment.  After reviewing this  information with the patient, the patient has agreed to receive one of the available covid 19 monoclonal antibodies and will be provided an appropriate fact sheet prior to infusion. Roe Rutherford Barnaby Rippeon, [place] 03/11/2020 12:04 PM

## 2020-03-12 ENCOUNTER — Ambulatory Visit (HOSPITAL_COMMUNITY)
Admission: RE | Admit: 2020-03-12 | Discharge: 2020-03-12 | Disposition: A | Discharge status: Home / Self Care | Payer: 59 | Source: Ambulatory Visit | Attending: Pulmonary Disease | Admitting: Pulmonary Disease

## 2020-03-12 ENCOUNTER — Other Ambulatory Visit (HOSPITAL_COMMUNITY): Payer: Self-pay

## 2020-03-12 DIAGNOSIS — U071 COVID-19: Secondary | ICD-10-CM | POA: Diagnosis not present

## 2020-03-12 DIAGNOSIS — E663 Overweight: Secondary | ICD-10-CM | POA: Diagnosis present

## 2020-03-12 MED ORDER — FAMOTIDINE IN NACL 20-0.9 MG/50ML-% IV SOLN: 20.0000 mg | Freq: Once | INTRAVENOUS | Status: DC | PRN

## 2020-03-12 MED ORDER — EPINEPHRINE 0.3 MG/0.3ML IJ SOAJ: 0.3000 mg | Freq: Once | INTRAMUSCULAR | Status: DC | PRN

## 2020-03-12 MED ORDER — ALBUTEROL SULFATE HFA 108 (90 BASE) MCG/ACT IN AERS: 2.0000 | INHALATION_SPRAY | Freq: Once | RESPIRATORY_TRACT | Status: DC | PRN

## 2020-03-12 MED ORDER — SODIUM CHLORIDE 0.9 % IV SOLN: INTRAVENOUS | Status: DC | PRN

## 2020-03-12 MED ORDER — SODIUM CHLORIDE 0.9 % IV SOLN: Freq: Once | INTRAVENOUS | Status: AC

## 2020-03-12 MED ORDER — METHYLPREDNISOLONE SODIUM SUCC 125 MG IJ SOLR: 125.0000 mg | Freq: Once | INTRAMUSCULAR | Status: DC | PRN

## 2020-03-12 MED ORDER — DIPHENHYDRAMINE HCL 50 MG/ML IJ SOLN: 50.0000 mg | Freq: Once | INTRAMUSCULAR | Status: DC | PRN

## 2020-03-12 NOTE — Discharge Instructions (Signed)

## 2020-03-12 NOTE — Progress Notes (Signed)
  Diagnosis: COVID-19  Physician: dr patrick wright  Procedure: [facility] Med: bamlanivimab\etesevimab infusion - Provided patient with bamlanimivab\etesevimab fact sheet for patients, parents and caregivers prior to infusion.  Complications: No immediate complications noted.  Discharge: Discharged home   Kaitlin Avila 03/12/2020  

## 2020-03-25 ENCOUNTER — Other Ambulatory Visit: Payer: Self-pay

## 2020-05-05 ENCOUNTER — Other Ambulatory Visit: Payer: 59

## 2020-05-05 ENCOUNTER — Other Ambulatory Visit: Payer: Self-pay

## 2020-05-05 DIAGNOSIS — R7989 Other specified abnormal findings of blood chemistry: Secondary | ICD-10-CM

## 2020-05-05 DIAGNOSIS — R17 Unspecified jaundice: Secondary | ICD-10-CM

## 2020-05-06 LAB — ALT: ALT: 26 U/L (ref 6–29)

## 2020-05-06 LAB — BILIRUBIN, TOTAL: Total Bilirubin: 1.6 mg/dL — ABNORMAL HIGH (ref 0.2–1.2)

## 2021-02-18 NOTE — Progress Notes (Signed)
40 y.o. G48P2002 Married Hispanic Avila here for annual exam.    Patient having irregular cycles. States having 2 cycles/month with OCPs for the last 2 years.  Does not have a menstruation every month.  Then can have a period twice a month at times.  Can miss a pill here and there.  The irregularity does not bother her.  No significant pain.   Has migraine without aura.   Patient did not see her PCP last year regarding her elevated bilirubin.   Mother of two children:  30 yo and 48 yo.  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   PCP:  Sigmund Hazel, MD   Patient's last menstrual period was 01/26/2021 (exact date).     Period Pattern: (!) Irregular     Sexually active: Yes.    The current method of family planning is OCP (estrogen/progesterone).    Exercising: Yes.     running Smoker:  no  Health Maintenance: Pap: 02-19-20 ASCUS:Neg HR HPV, 02-10-17 Neg, 02-19-15 Neg History of abnormal Pap:  no MMG:  n/a Colonoscopy:  n/a BMD:   n/a  Result  n/a TDaP: more than 10 years ago Gardasil:   yes HIV: Neg in preg Hep C: unsure Screening Labs:  PCP.   reports that she has never smoked. She has never used smokeless tobacco. She reports that she does not currently use alcohol. She reports that she does not use drugs.  Past Medical History:  Diagnosis Date   Congenital uterine anomaly    septated uterus, double cervices, longitudinal vaginal septum   LSIL (low grade squamous intraepithelial lesion) on Pap smear    RIGHT - 10/10... ASCUS LEFT - NEGATIVE HR HPV 07/2009   Migraines    no aura    Past Surgical History:  Procedure Laterality Date   HYSTEROSCOPY  10/23/2009   EXCISION OF VAGINAL and UTERINE SEPTUM.     Current Outpatient Medications  Medication Sig Dispense Refill   norgestimate-ethinyl estradiol (ORTHO-CYCLEN) 0.25-35 MG-MCG tablet Take 1 tablet by mouth daily. 84 tablet 4   No current facility-administered medications for this visit.    Family  History  Problem Relation Age of Onset   Hypertension Mother    Hypertension Sister    Thyroid disease Sister    Diabetes Father    Diabetes Paternal Grandmother     Review of Systems  All other systems reviewed and are negative.  Exam:   BP 104/70   Pulse 66   Ht 4' 11.5" (1.511 m)   Wt 154 lb (69.9 kg)   LMP 01/26/2021 (Exact Date)   SpO2 99%   BMI 30.58 kg/m     General appearance: alert, cooperative and appears stated age Head: normocephalic, without obvious abnormality, atraumatic Neck: no adenopathy, supple, symmetrical, trachea midline and thyroid normal to inspection and palpation Lungs: clear to auscultation bilaterally Breasts: normal appearance, no masses or tenderness, No nipple retraction or dimpling, No nipple discharge or bleeding, No axillary adenopathy Heart: regular rate and rhythm Abdomen: soft, non-tender; no masses, no organomegaly Extremities: extremities normal, atraumatic, no cyanosis or edema Skin: skin color, texture, turgor normal. No rashes or lesions Lymph nodes: cervical, supraclavicular, and axillary nodes normal. Neurologic: grossly normal  Pelvic: External genitalia:  no lesions              No abnormal inguinal nodes palpated.              Urethra:  normal appearing urethra with  no masses, tenderness or lesions              Bartholins and Skenes: normal                 Vagina: normal appearing vagina with normal color and discharge, no lesions              Cervix: no lesions.  Appears to have one cervix.  Vaginal tissue near cervix consistent with vaginal septum remnant.               Pap taken:  no Bimanual Exam:  Uterus:  normal size, contour, position, consistency, mobility, non-tender              Adnexa: no mass, fullness, tenderness           Chaperone was present for exam:  Marchelle Folks, CMA  Assessment:   Well woman visit with gynecologic exam. Breakthrough bleeding COCs.  Hx septate uterus, vaginal septum, and double cervix.   Status post excision of vaginal and uterine septum.  Migraine HA. No aura. Elevated bilirubin.   Plan: Mammogram screening discussed. Self breast awareness reviewed. Pap and HR HPV 2024. Guidelines for Calcium, Vitamin D, regular exercise program including cardiovascular and weight bearing exercise. Refill of COCs for one year.  She will call me back if her cycles are consistently irregular.  Would then consider ultrasound.  She will see her PCP for lab work and to recheck her bilirubin level. TDap today.  I discussed Covid vaccines and flu vaccines at local pharmacy.  Follow up annually and prn.   After visit summary provided.

## 2021-02-19 ENCOUNTER — Encounter: Payer: Self-pay | Admitting: Obstetrics and Gynecology

## 2021-02-19 ENCOUNTER — Ambulatory Visit (INDEPENDENT_AMBULATORY_CARE_PROVIDER_SITE_OTHER): Payer: 59 | Admitting: Obstetrics and Gynecology

## 2021-02-19 ENCOUNTER — Other Ambulatory Visit: Payer: Self-pay

## 2021-02-19 ENCOUNTER — Encounter: Payer: 59 | Admitting: Obstetrics and Gynecology

## 2021-02-19 VITALS — BP 104/70 | HR 66 | Ht 59.5 in | Wt 154.0 lb

## 2021-02-19 DIAGNOSIS — Z01419 Encounter for gynecological examination (general) (routine) without abnormal findings: Secondary | ICD-10-CM

## 2021-02-19 DIAGNOSIS — Z23 Encounter for immunization: Secondary | ICD-10-CM | POA: Diagnosis not present

## 2021-02-19 MED ORDER — NORGESTIMATE-ETH ESTRADIOL 0.25-35 MG-MCG PO TABS
1.0000 | ORAL_TABLET | Freq: Every day | ORAL | 4 refills | Status: DC
Start: 2021-02-19 — End: 2022-02-24

## 2021-02-19 NOTE — Patient Instructions (Signed)

## 2021-03-19 DIAGNOSIS — E669 Obesity, unspecified: Secondary | ICD-10-CM

## 2021-03-19 HISTORY — DX: Obesity, unspecified: E66.9

## 2021-04-22 ENCOUNTER — Other Ambulatory Visit: Payer: Self-pay | Admitting: Family Medicine

## 2021-04-22 DIAGNOSIS — R7989 Other specified abnormal findings of blood chemistry: Secondary | ICD-10-CM

## 2021-05-27 ENCOUNTER — Ambulatory Visit
Admission: RE | Admit: 2021-05-27 | Discharge: 2021-05-27 | Disposition: A | Discharge status: Home / Self Care | Payer: 59 | Source: Ambulatory Visit | Attending: Family Medicine | Admitting: Family Medicine

## 2021-05-27 DIAGNOSIS — R7989 Other specified abnormal findings of blood chemistry: Secondary | ICD-10-CM

## 2021-06-10 DIAGNOSIS — F419 Anxiety disorder, unspecified: Secondary | ICD-10-CM

## 2021-06-10 HISTORY — DX: Anxiety disorder, unspecified: F41.9

## 2021-06-16 ENCOUNTER — Other Ambulatory Visit: Payer: Self-pay | Admitting: Family Medicine

## 2021-06-16 DIAGNOSIS — R160 Hepatomegaly, not elsewhere classified: Secondary | ICD-10-CM

## 2021-06-28 ENCOUNTER — Ambulatory Visit
Admission: RE | Admit: 2021-06-28 | Discharge: 2021-06-28 | Disposition: A | Discharge status: Home / Self Care | Payer: 59 | Source: Ambulatory Visit | Attending: Family Medicine | Admitting: Family Medicine

## 2021-06-28 ENCOUNTER — Other Ambulatory Visit: Payer: Self-pay

## 2021-06-28 DIAGNOSIS — R160 Hepatomegaly, not elsewhere classified: Secondary | ICD-10-CM

## 2021-06-28 MED ORDER — GADOBENATE DIMEGLUMINE 529 MG/ML IV SOLN
13.0000 mL | Freq: Once | INTRAVENOUS | Status: AC | PRN
  Administered 2021-06-28: 13 mL via INTRAVENOUS

## 2022-02-22 NOTE — Progress Notes (Unsigned)
41 y.o. G51P2002 Separated Hispanic female here for annual exam.    Stopped birth control, and her periods are more regular.  Heaviness of flow is about the same.  Does not need birth control.   She has a diagnosis of fatty liver.   Is now separated. She desires STD screening.   PCP:  Kathyrn Lass, MD   Patient's last menstrual period was 01/23/2022 (approximate).     Period Cycle (Days): 30 Period Duration (Days): 4 Period Pattern: Regular Menstrual Flow:  (day 2 and 3 heavy) Menstrual Control: Tampon, Maxi pad Menstrual Control Change Freq (Hours): changes pad and tampon every 2 hours on heaviest day Dysmenorrhea: None     Sexually active: No.  The current method of family planning is Abstinence  Exercising: No.   Some walking, running, occ weight Smoker:  no  Health Maintenance: Pap:  02-19-20 ASCUS:Neg HR HPV, 02-10-17 Neg, 02-19-15 Neg History of abnormal Pap:  no MMG:  07-02-21 normal thru work Colonoscopy:  n/a BMD:   n/a  Result  n/a TDaP:  02-19-21 Gardasil:   yes HIV: neg in past Hep C:no Screening Labs:  PCP   reports that she has never smoked. She has never used smokeless tobacco. She reports that she does not currently use alcohol. She reports that she does not use drugs.  Past Medical History:  Diagnosis Date   Chlamydia 2014   Congenital uterine anomaly    septated uterus, double cervices, longitudinal vaginal septum   Fatty liver    LSIL (low grade squamous intraepithelial lesion) on Pap smear    RIGHT - 10/10... ASCUS LEFT - NEGATIVE HR HPV 07/2009   Migraines    no aura    Past Surgical History:  Procedure Laterality Date   HYSTEROSCOPY  10/23/2009   EXCISION OF VAGINAL and UTERINE SEPTUM.     Current Outpatient Medications  Medication Sig Dispense Refill   escitalopram (LEXAPRO) 10 MG tablet Take 10 mg by mouth daily.     hydrOXYzine (ATARAX) 25 MG tablet 1 tablet at bedtime as needed     No current facility-administered medications for this  visit.    Family History  Problem Relation Age of Onset   Hypertension Mother    Hypertension Sister    Thyroid disease Sister    Diabetes Father    Diabetes Paternal Grandmother     Review of Systems  All other systems reviewed and are negative.   Exam:   BP 100/64   Pulse 68   Ht 4' 11.75" (1.518 m)   Wt 153 lb (69.4 kg)   LMP 01/23/2022 (Approximate)   SpO2 99%   BMI 30.13 kg/m     General appearance: alert, cooperative and appears stated age Head: normocephalic, without obvious abnormality, atraumatic Neck: no adenopathy, supple, symmetrical, trachea midline and thyroid normal to inspection and palpation Lungs: clear to auscultation bilaterally Breasts: normal appearance, no masses or tenderness, No nipple retraction or dimpling, No nipple discharge or bleeding, No axillary adenopathy Heart: regular rate and rhythm Abdomen: soft, non-tender; no masses, no organomegaly Extremities: extremities normal, atraumatic, no cyanosis or edema Skin: skin color, texture, turgor normal. No rashes or lesions Lymph nodes: cervical, supraclavicular, and axillary nodes normal. Neurologic: grossly normal  Pelvic: External genitalia:  no lesions              No abnormal inguinal nodes palpated.              Urethra:  normal appearing urethra with no  masses, tenderness or lesions              Bartholins and Skenes: normal                 Vagina: normal appearing vagina with normal color and discharge, no lesions              Cervix: no lesions              Pap taken: no Bimanual Exam:  Uterus:  normal size, contour, position, consistency, mobility, non-tender              Adnexa: no mass, fullness, tenderness              Chaperone was present for exam:  Marchelle Folks, CMA  Assessment:   Well woman visit with gynecologic exam. Hx septate uterus, vaginal septum, and double cervix.  Status post excision of vaginal and uterine septum.  Migraine HA. No aura. Elevated bilirubin.  Fatty  liver. STD screening.   Plan: Mammogram screening discussed. Self breast awareness reviewed. Pap and HR HPV 2024. Guidelines for Calcium, Vitamin D, regular exercise program including cardiovascular and weight bearing exercise. STD screening.   Follow up annually and prn.   After visit summary provided.

## 2022-02-24 ENCOUNTER — Other Ambulatory Visit (HOSPITAL_COMMUNITY)
Admission: RE | Admit: 2022-02-24 | Discharge: 2022-02-24 | Disposition: A | Discharge status: Home / Self Care | Payer: 59 | Source: Ambulatory Visit | Attending: Obstetrics and Gynecology | Admitting: Obstetrics and Gynecology

## 2022-02-24 ENCOUNTER — Encounter: Payer: Self-pay | Admitting: Obstetrics and Gynecology

## 2022-02-24 ENCOUNTER — Ambulatory Visit (INDEPENDENT_AMBULATORY_CARE_PROVIDER_SITE_OTHER): Payer: 59 | Admitting: Obstetrics and Gynecology

## 2022-02-24 VITALS — BP 100/64 | HR 68 | Ht 59.75 in | Wt 153.0 lb

## 2022-02-24 DIAGNOSIS — Z114 Encounter for screening for human immunodeficiency virus [HIV]: Secondary | ICD-10-CM

## 2022-02-24 DIAGNOSIS — Z01419 Encounter for gynecological examination (general) (routine) without abnormal findings: Secondary | ICD-10-CM | POA: Diagnosis not present

## 2022-02-24 DIAGNOSIS — Z1159 Encounter for screening for other viral diseases: Secondary | ICD-10-CM

## 2022-02-24 DIAGNOSIS — Z113 Encounter for screening for infections with a predominantly sexual mode of transmission: Secondary | ICD-10-CM

## 2022-02-24 NOTE — Patient Instructions (Signed)

## 2022-02-25 ENCOUNTER — Other Ambulatory Visit: Payer: 59

## 2022-02-25 LAB — RPR: RPR Ser Ql: NONREACTIVE

## 2022-02-25 LAB — CERVICOVAGINAL ANCILLARY ONLY
Chlamydia: NEGATIVE
Comment: NEGATIVE
Comment: NEGATIVE
Comment: NORMAL
Neisseria Gonorrhea: NEGATIVE
Trichomonas: NEGATIVE

## 2022-02-25 LAB — HIV ANTIBODY (ROUTINE TESTING W REFLEX): HIV 1&2 Ab, 4th Generation: NONREACTIVE

## 2022-02-25 LAB — HEPATITIS C ANTIBODY: Hepatitis C Ab: NONREACTIVE

## 2022-03-25 ENCOUNTER — Other Ambulatory Visit: Payer: Self-pay | Admitting: Obstetrics and Gynecology

## 2022-03-26 NOTE — Telephone Encounter (Signed)
Last annual exam 01/2022  Per note at annual exam patient stopped BCP

## 2022-11-01 ENCOUNTER — Encounter: Payer: Self-pay | Admitting: Diagnostic Neuroimaging

## 2022-11-01 ENCOUNTER — Ambulatory Visit (INDEPENDENT_AMBULATORY_CARE_PROVIDER_SITE_OTHER): Payer: 59 | Admitting: Diagnostic Neuroimaging

## 2022-11-01 VITALS — BP 128/79 | HR 63 | Ht 60.0 in | Wt 161.2 lb

## 2022-11-01 DIAGNOSIS — R29898 Other symptoms and signs involving the musculoskeletal system: Secondary | ICD-10-CM

## 2022-11-01 NOTE — Patient Instructions (Signed)
  Left hand weakness / numbness (since Jan 2024) - check EMG/NCS (left ulnar neuropathy evaluation)

## 2022-11-01 NOTE — Progress Notes (Signed)
[facility]  PATIENT: Kaitlin Avila DOB: [birth year]  REFERRING CLINICIAN: Christena Deem, MD HISTORY FROM: patient  REASON FOR VISIT: new consult   HISTORICAL  CHIEF COMPLAINT:  Chief Complaint  Patient presents with   New Patient (Initial Visit)    Patient in room #7 and alone. Patient states she has numbness run from the elbow dow to her fingers.    HISTORY OF PRESENT ILLNESS:   42 year old female here for evaluation of numbness in left hand.  Symptoms started January 2024.  She describes numbness tingling sensation in her left hand mainly in digits 5 and partially digit 4.  Sometimes she feels sensation from her elbow down to her hand.  At one point she felt some achiness around her shoulder as well.  No accidents injuries or traumas.  No problems with right side.  No problems with legs.  No problems with face speech or swallowing.  No vision changes.   REVIEW OF SYSTEMS: Full 14 system review of systems performed and negative with exception of: as per HPI.  ALLERGIES: No Known Allergies  HOME MEDICATIONS: Outpatient Medications Prior to Visit  Medication Sig Dispense Refill   escitalopram (LEXAPRO) 10 MG tablet Take 10 mg by mouth daily.     hydrOXYzine (ATARAX) 25 MG tablet 1 tablet at bedtime as needed     meloxicam (MOBIC) 15 MG tablet Take 15 mg by mouth daily as needed.     No facility-administered medications prior to visit.    PAST MEDICAL HISTORY: Past Medical History:  Diagnosis Date   Anxiety 06/10/2021   Chlamydia 2014   Congenital uterine anomaly    septated uterus, double cervices, longitudinal vaginal septum   Fatty liver    Gastritis    LSIL (low grade squamous intraepithelial lesion) on Pap smear    RIGHT - 10/10... ASCUS LEFT - NEGATIVE HR HPV 07/2009   Migraines    no aura   Obesity 03/19/2021    PAST SURGICAL HISTORY: Past Surgical History:  Procedure Laterality Date   HYSTEROSCOPY  10/23/2009   EXCISION  OF VAGINAL and UTERINE SEPTUM.     FAMILY HISTORY: Family History  Problem Relation Age of Onset   Hypertension Mother    Hypertension Sister    Thyroid disease Sister    Diabetes Father    Diabetes Paternal Grandmother     SOCIAL HISTORY: Social History   Socioeconomic History   Marital status: Married    Spouse name: Not on file   Number of children: Not on file   Years of education: Not on file   Highest education level: Not on file  Occupational History   Not on file  Tobacco Use   Smoking status: Never   Smokeless tobacco: Never  Vaping Use   Vaping Use: Never used  Substance and Sexual Activity   Alcohol use: Not Currently    Comment: 1 drink/month   Drug use: No   Sexual activity: Not Currently    Birth control/protection: Abstinence    Comment: 1st intercourse 41 yo-1 partner  Other Topics Concern   Not on file  Social History Narrative   Not on file   Social Determinants of Health   Financial Resource Strain: Not on file  Food Insecurity: Not on file  Transportation Needs: Not on file  Physical Activity: Not on file  Stress: Not on file  Social Connections: Not on file  Intimate Partner Violence: Not on file     PHYSICAL  EXAM  GENERAL EXAM/CONSTITUTIONAL: Vitals:  Vitals:   11/01/22 0811  BP: 128/79  Pulse: 63  Weight: 161 lb 3.2 oz (73.1 kg)  Height: 5' (1.524 m)   Body mass index is 31.48 kg/m. Wt Readings from Last 3 Encounters:  11/01/22 161 lb 3.2 oz (73.1 kg)  02/24/22 153 lb (69.4 kg)  02/19/21 154 lb (69.9 kg)   Patient is in no distress; well developed, nourished and groomed; neck is supple  CARDIOVASCULAR: Examination of carotid arteries is normal; no carotid bruits Regular rate and rhythm, no murmurs Examination of peripheral vascular system by observation and palpation is normal  EYES: Ophthalmoscopic exam of optic discs and posterior segments is normal; no papilledema or hemorrhages No results  found.  MUSCULOSKELETAL: Gait, strength, tone, movements noted in Neurologic exam below  NEUROLOGIC: MENTAL STATUS:      No data to display         awake, alert, oriented to person, place and time recent and remote memory intact normal attention and concentration language fluent, comprehension intact, naming intact fund of knowledge appropriate  CRANIAL NERVE:  2nd - no papilledema on fundoscopic exam 2nd, 3rd, 4th, 6th - pupils equal and reactive to light, visual fields full to confrontation, extraocular muscles intact, no nystagmus 5th - facial sensation symmetric 7th - facial strength symmetric 8th - hearing intact 9th - palate elevates symmetrically, uvula midline 11th - shoulder shrug symmetric 12th - tongue protrusion midline  MOTOR:  normal bulk and tone, full strength in the BUE, BLE; EXCEPT MILD ATROPHY OF LEFT HAND FDI; MILD WEAKNESS IN LEFT HAND FINGER ABDUCTION AND FINGER FLEXION (DIGITS 5 > 4)  SENSORY:  normal and symmetric to light touch, pinprick, temperature, vibration; EXCEPT SLIGHTLY ABNL IN LEFT HAND DIGIT 5 (ALLODYNIA TO PP)  COORDINATION:  finger-nose-finger, fine finger movements normal  REFLEXES:  deep tendon reflexes present and symmetric  GAIT/STATION:  narrow based gait     DIAGNOSTIC DATA (LABS, IMAGING, TESTING) - I reviewed patient records, labs, notes, testing and imaging myself where available.  Lab Results  Component Value Date   WBC 6.5 02/19/2020   HGB 14.4 02/19/2020   HCT 42.4 02/19/2020   MCV 93.4 02/19/2020   PLT 311 02/19/2020      Component Value Date/Time   NA 140 02/19/2020 0848   K 4.5 02/19/2020 0848   CL 105 02/19/2020 0848   CO2 25 02/19/2020 0848   GLUCOSE 95 02/19/2020 0848   BUN 11 02/19/2020 0848   CREATININE 0.70 02/19/2020 0848   CALCIUM 9.7 02/19/2020 0848   PROT 7.2 02/19/2020 0848   ALBUMIN 4.1 02/19/2015 0935   AST 22 02/19/2020 0848   ALT 26 05/05/2020 0956   ALKPHOS 81 02/19/2015 0935    BILITOT 1.6 (H) 05/05/2020 0956   Lab Results  Component Value Date   CHOL 205 (H) 02/19/2020   HDL 50 02/19/2020   LDLCALC 130 (H) 02/19/2020   TRIG 136 02/19/2020   CHOLHDL 4.1 02/19/2020   No results found for: "HGBA1C" No results found for: "VITAMINB12" Lab Results  Component Value Date   TSH 1.23 02/19/2020       ASSESSMENT AND PLAN  42 y.o. year old female here with:   Dx:  1. Left hand weakness     PLAN:  Left hand weakness / numbness (since Jan 2024) - check EMG/NCS (left ulnar neuropathy evaluation)  Orders Placed This Encounter  Procedures   NCV with EMG(electromyography)   Return for for NCV/EMG.  Suanne Marker, MD 11/01/2022, 9:08 AM Certified in Neurology, Neurophysiology and Neuroimaging  [facility] [address] [place], [place] [address] [phone]

## 2022-12-09 ENCOUNTER — Ambulatory Visit (INDEPENDENT_AMBULATORY_CARE_PROVIDER_SITE_OTHER): Payer: Self-pay | Admitting: Diagnostic Neuroimaging

## 2022-12-09 ENCOUNTER — Ambulatory Visit (INDEPENDENT_AMBULATORY_CARE_PROVIDER_SITE_OTHER): Payer: 59 | Admitting: Diagnostic Neuroimaging

## 2022-12-09 VITALS — BP 127/82 | HR 65 | Ht 60.0 in | Wt 160.0 lb

## 2022-12-09 DIAGNOSIS — R29898 Other symptoms and signs involving the musculoskeletal system: Secondary | ICD-10-CM

## 2022-12-09 DIAGNOSIS — Z0289 Encounter for other administrative examinations: Secondary | ICD-10-CM

## 2022-12-09 NOTE — Progress Notes (Signed)
Normal nerve conduction studies.  Chronic denervation in left first dorsal interosseous muscle.  May reflect sequelae of prior ulnar neuropathy.  Patient reports onset of symptoms about around January 2024, during a time when she was very busy at work and placing a lot of pressure on the left arm.  Symptoms reached peak intensity within 1 to 2 weeks and then have subsided.  Now symptoms have improved but plateaued.  Recommend to continue exercises and monitor symptoms.  If symptoms worsen then may consider additional imaging of the left wrist and elbow to look for compressive etiologies.  Suanne Marker, MD 12/09/2022, 8:12 PM Certified in Neurology, Neurophysiology and Neuroimaging  [facility] [address], [address] [place], [place] [address] [phone]

## 2022-12-09 NOTE — Procedures (Signed)
[facility]  NCS (NERVE CONDUCTION STUDY) WITH EMG (ELECTROMYOGRAPHY) REPORT   STUDY DATE: 12/09/22 PATIENT NAME: Kaitlin Avila DOB: [birth year] MRN: [id number]  ORDERING CLINICIAN: Joycelyn Schmid, MD   TECHNOLOGIST: Marylu Lund ELECTROMYOGRAPHER: Glenford Bayley. Deshaun Weisinger, MD  CLINICAL INFORMATION: 42 year old female with left hand numbness.  FINDINGS: NERVE CONDUCTION STUDY:  Left median and left ulnar motor responses are normal.  Left median and left ulnar sensory sponsors are normal.  Left radial sensory response is normal.  Left median to ulnar transcarpal comparison study is normal.  Left ulnar F-wave latency is normal.   NEEDLE ELECTROMYOGRAPHY:  Needle examination of the left upper extremity deltoid, biceps, triceps, flexor carpi radialis, flexor carpi ulnaris, first dorsal interosseous is normal except for chronic denervation in left first dorsal interosseous.  No abnormal spontaneous activity noted.  IMPRESSION:   Mildly abnormal study demonstrating: -Chronic denervation noted in left first dorsal interosseous.  Nerve conduction studies are normal.  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    INTERPRETING PHYSICIAN:  Suanne Marker, MD Certified in Neurology, Neurophysiology and Neuroimaging  [facility] [address], [address] [place], [place] [address] [phone]   [facility]    Nerve / Sites Muscle Latency Ref. Amplitude Ref. Rel Amp Segments Distance Velocity Ref. Area    ms ms mV mV %  cm m/s m/s mVms  L Median - APB     Wrist APB 2.9 ?4.4 6.7 ?4.0 100 Wrist - APB 7   26.6     Upper arm APB 6.9  5.4  79.7 Upper arm - Wrist 24 60 ?49 22.7  L Ulnar - ADM     Wrist ADM 2.5 ?3.3 6.1 ?6.0 100 Wrist - ADM 7   19.0     B.Elbow ADM 4.0  5.9  96.6 B.Elbow - Wrist 9 59 ?49 17.4     A.Elbow ADM 7.0  5.1  86.8 A.Elbow - B.Elbow 15 50 ?49 17.6  L Ulnar - FDI      Wrist FDI 3.0 ?4.5 8.9 ?7.0 100 Wrist - FDI 8   20.7     B.Elbow FDI 4.9  9.2  104 B.Elbow - Wrist 10 52 ?49 21.1     A.Elbow FDI 7.9  8.3  90 A.Elbow - B.Elbow 15 51 ?49 19.5         A.Elbow - Wrist               SNC    Nerve / Sites Rec. Site Peak Lat Ref.  Amp Ref. Segments Distance Peak Diff Ref.    ms ms V V  cm ms ms  L Radial - Anatomical snuff box (Forearm)     Forearm Wrist 2.5 ?2.9 72 ?15 Forearm - Wrist 10    L Median, Ulnar - Transcarpal comparison     Median Palm Wrist 1.9 ?2.2 83 ?35 Median Palm - Wrist 8       Ulnar Palm Wrist 1.9 ?2.2 29 ?12 Ulnar Palm - Wrist 8          Median Palm - Ulnar Palm  -0.1 ?0.4  L Median - Orthodromic (Dig II, Mid palm)     Dig II Wrist 2.7 ?3.4 31 ?10 Dig II - Wrist 13    L Ulnar - Orthodromic, (Dig V, Mid palm)     Dig V Wrist 2.7 ?3.1 6 ?5 Dig V - Wrist 11  F  Wave    Nerve F Lat Ref.   ms ms  L Ulnar - ADM 24.9 ?32.0       EMG Summary Table    Spontaneous MUAP Recruitment  Muscle IA Fib PSW Fasc Other Amp Dur. Poly Pattern  L. Deltoid Normal None None None _______ Normal Normal Normal Normal  L. Biceps brachii Normal None None None _______ Normal Normal Normal Normal  L. Triceps brachii Normal None None None _______ Normal Normal Normal Normal  L. Flexor carpi radialis Normal None None None _______ Normal Normal Normal Normal  L. Flexor carpi ulnaris Normal None None None _______ Normal Normal Normal Normal  L. First dorsal interosseous Normal None None None _______ Increased Normal Normal Reduced

## 2023-02-16 NOTE — Progress Notes (Signed)
42 y.o. [id number] Legally Separated Hispanic female here for annual exam.    Reconciled with her husband, and is sexually active now.  Wants birth control.  Difficulty with taking a pill daily.   Had STD screening with her PCP, and it was negative.   PCP:   Sigmund Hazel, MD  Patient's last menstrual period was 02/21/2023.     Period Cycle (Days): 28 Period Duration (Days): 4 Period Pattern: Regular Menstrual Flow: Moderate, Light Menstrual Control: Maxi pad Dysmenorrhea: None     Sexually active: Yes  The current method of family planning is condoms.    Exercising: Yes.     Weight lifting, running Smoker:  no  Health Maintenance: Pap:  02/19/20 ASCUS:Neg HR HPV, 02-10-17 Neg, 02-19-15 Neg  History of abnormal Pap:  yes MMG:  07/15/22 - BI-RADS2. Colonoscopy:  n/a BMD:   n/a  Result  n/a TDaP:  02/19/21 Gardasil:   yes HIV: 02/24/22 NR Hep C: 02/24/22 NR Screening Labs:  PCP   reports that she has never smoked. She has never used smokeless tobacco. She reports that she does not currently use alcohol. She reports that she does not use drugs.  Past Medical History:  Diagnosis Date   Anxiety 06/10/2021   Chlamydia 2014   Congenital uterine anomaly    septated uterus, double cervices, longitudinal vaginal septum   Fatty liver    Gastritis    LSIL (low grade squamous intraepithelial lesion) on Pap smear    RIGHT - 10/10... ASCUS LEFT - NEGATIVE HR HPV 07/2009   Migraines    no aura   Obesity 03/19/2021    Past Surgical History:  Procedure Laterality Date   HYSTEROSCOPY  10/23/2009   EXCISION OF VAGINAL and UTERINE SEPTUM.     Current Outpatient Medications  Medication Sig Dispense Refill   hydrOXYzine (ATARAX) 25 MG tablet 1 tablet at bedtime as needed     meloxicam (MOBIC) 15 MG tablet Take 15 mg by mouth daily as needed. (Patient not taking: Reported on 03/02/2023)     No current facility-administered medications for this visit.    Family History  Problem Relation  Age of Onset   Hypertension Mother    Hypertension Sister    Thyroid disease Sister    Diabetes Father    Diabetes Paternal Grandmother     Review of Systems  All other systems reviewed and are negative.   Exam:   BP 122/70 (BP Location: Left Arm, Patient Position: Sitting, Cuff Size: Normal)   Ht 5' 0.5" (1.537 m)   Wt 158 lb (71.7 kg)   LMP 02/21/2023   BMI 30.35 kg/m     General appearance: alert, cooperative and appears stated age Head: normocephalic, without obvious abnormality, atraumatic Neck: no adenopathy, supple, symmetrical, trachea midline and thyroid normal to inspection and palpation Lungs: clear to auscultation bilaterally Breasts: normal appearance, no masses or tenderness, No nipple retraction or dimpling, No nipple discharge or bleeding, No axillary adenopathy Heart: regular rate and rhythm Abdomen: soft, non-tender; no masses, no organomegaly Extremities: extremities normal, atraumatic, no cyanosis or edema Skin: skin color, texture, turgor normal. No rashes or lesions Lymph nodes: cervical, supraclavicular, and axillary nodes normal. Neurologic: grossly normal  Pelvic: External genitalia:  no lesions              No abnormal inguinal nodes palpated.              Urethra:  normal appearing urethra with no masses, tenderness or lesions  Bartholins and Skenes: normal                 Vagina: normal appearing vagina with normal color and discharge, no lesions              Cervix: no lesions              Pap taken: yes Bimanual Exam:  Uterus:  normal size, contour, position, consistency, mobility, non-tender              Adnexa: no mass, fullness, tenderness              Rectal exam: yes.  Confirms.              Anus:  normal sphincter tone, no lesions  Chaperone was present for exam:  Warren Lacy, CMA  Assessment:   Well woman visit with gynecologic exam. Hx septate uterus, vaginal septum, and double cervix.  Status post excision of vaginal and  uterine septum.  Migraine HA. No aura. Hx elevated bilirubin.  Fatty liver.  Plan: Mammogram screening discussed. Self breast awareness reviewed. Pap and HR HPV collected. Guidelines for Calcium, Vitamin D, regular exercise program including cardiovascular and weight bearing exercise. Birth control options reviewed.  Rx for OrthoEvra.  Instructed in use.  Potential side effects and risks of stroke, DVT, PE, and MI reviewed.  Labs with PCP.  Follow up annually and prn.

## 2023-03-02 ENCOUNTER — Encounter: Payer: Self-pay | Admitting: Obstetrics and Gynecology

## 2023-03-02 ENCOUNTER — Other Ambulatory Visit (HOSPITAL_COMMUNITY)
Admission: RE | Admit: 2023-03-02 | Discharge: 2023-03-02 | Disposition: A | Discharge status: Home / Self Care | Payer: 59 | Source: Ambulatory Visit | Attending: Obstetrics and Gynecology | Admitting: Obstetrics and Gynecology

## 2023-03-02 ENCOUNTER — Ambulatory Visit (INDEPENDENT_AMBULATORY_CARE_PROVIDER_SITE_OTHER): Payer: 59 | Admitting: Obstetrics and Gynecology

## 2023-03-02 VITALS — BP 122/70 | Ht 60.5 in | Wt 158.0 lb

## 2023-03-02 DIAGNOSIS — Z124 Encounter for screening for malignant neoplasm of cervix: Secondary | ICD-10-CM

## 2023-03-02 DIAGNOSIS — Z01419 Encounter for gynecological examination (general) (routine) without abnormal findings: Secondary | ICD-10-CM | POA: Diagnosis not present

## 2023-03-02 MED ORDER — NORELGESTROMIN-ETH ESTRADIOL 150-35 MCG/24HR TD PTWK: 1.0000 | MEDICATED_PATCH | TRANSDERMAL | 3 refills | Status: DC

## 2023-03-02 NOTE — Patient Instructions (Signed)

## 2023-03-07 LAB — CYTOLOGY - PAP
Comment: NEGATIVE
Diagnosis: NEGATIVE
High risk HPV: NEGATIVE

## 2023-05-26 ENCOUNTER — Ambulatory Visit (INDEPENDENT_AMBULATORY_CARE_PROVIDER_SITE_OTHER): Payer: 59 | Admitting: Radiology

## 2023-05-26 VITALS — BP 116/82 | HR 72 | Temp 98.9°F

## 2023-05-26 DIAGNOSIS — N907 Vulvar cyst: Secondary | ICD-10-CM

## 2023-05-26 DIAGNOSIS — R102 Pelvic and perineal pain: Secondary | ICD-10-CM | POA: Diagnosis not present

## 2023-05-26 LAB — URINALYSIS
Hgb urine dipstick: NEGATIVE
Ketones, ur: NEGATIVE
Leukocytes,Ua: NEGATIVE
Nitrite: NEGATIVE
Specific Gravity, Urine: >1.03 (ref 1.001–1.035)
pH: 5.5 (ref 5.0–8.0)

## 2023-05-26 LAB — PREGNANCY, URINE: Preg Test, Ur: NEGATIVE

## 2023-05-26 MED ORDER — SULFAMETHOXAZOLE-TRIMETHOPRIM 800-160 MG PO TABS: 1.0000 | ORAL_TABLET | Freq: Two times a day (BID) | ORAL | 0 refills | Status: AC

## 2023-05-26 NOTE — Addendum Note (Signed)
Addended by: Arlie Solomons on: 05/26/2023 12:42 PM   Modules accepted: Orders

## 2023-05-26 NOTE — Addendum Note (Signed)
Addended by: Rushie Goltz on: 05/26/2023 12:37 PM   Modules accepted: Orders

## 2023-05-26 NOTE — Progress Notes (Signed)
      Subjective: Kaitlin Avila is a 42 y.o. female who complains of pain and a bump on her left lower labia x 10 days, had some bleeding from it but no drainage. Uncomfortable to have intercourse. No urinary symptoms. Did have some discomfort in the upper groin lymph nodes as well which has significantly improved.    Review of Systems  All other systems reviewed and are negative.   Past Medical History:  Diagnosis Date   Anxiety 06/10/2021   Chlamydia 2014   Congenital uterine anomaly    septated uterus, double cervices, longitudinal vaginal septum   Fatty liver    Gastritis    LSIL (low grade squamous intraepithelial lesion) on Pap smear    RIGHT - 10/10... ASCUS LEFT - NEGATIVE HR HPV 07/2009   Migraines    no aura   Obesity 03/19/2021      Objective:  Today's Vitals   05/26/23 1027  BP: 116/82  Pulse: 72  Temp: 98.9 F (37.2 C)  TempSrc: Oral  SpO2: 96%   There is no height or weight on file to calculate BMI.   Physical Exam Vitals and nursing note reviewed. Exam conducted with a chaperone present.  Constitutional:      Appearance: Normal appearance. She is well-developed.  Pulmonary:     Effort: Pulmonary effort is normal.  Abdominal:     General: Abdomen is flat.     Palpations: Abdomen is soft.  Genitourinary:    General: Normal vulva.     Labia:        Left: Tenderness and lesion present.      Vagina: Vaginal discharge present. No erythema, bleeding or lesions.     Cervix: Normal. No discharge, friability, lesion or erythema.     Uterus: Normal.      Adnexa: Right adnexa normal and left adnexa normal.       Comments: 2cm sebaceous cyst present on the lower left labia outside of the hymenal ring. No bartholin involvement. Scant yellow drainage when pressure applied. Lymphadenopathy:     Lower Body: Left inguinal adenopathy present.  Neurological:     Mental Status: She is alert.  Psychiatric:        Mood and Affect: Mood normal.         Thought Content: Thought content normal.        Judgment: Judgment normal.     [name], CMA present for exam  Assessment:/Plan:  1. Sebaceous cyst of labia (Primary) Warm soaks or compresses BID Call if no improvement after finishing antibiotics.  - sulfamethoxazole-trimethoprim (BACTRIM DS) 800-160 MG tablet; Take 1 tablet by mouth 2 (two) times daily for 7 days.  Dispense: 14 tablet; Refill: 0  2. Pelvic pain - Urinalysis,Complete w/RFL Culture - Pregnancy, urine   [name], NP 10:37 AM

## 2023-10-05 ENCOUNTER — Other Ambulatory Visit: Payer: Self-pay

## 2023-10-05 ENCOUNTER — Telehealth: Payer: Self-pay | Admitting: Obstetrics and Gynecology

## 2023-10-05 ENCOUNTER — Other Ambulatory Visit: Payer: Self-pay | Admitting: Obstetrics and Gynecology

## 2023-10-05 NOTE — Telephone Encounter (Signed)
 Please confirm if patient wants a prescription for LoEstrin 1/20 Fe or Orthocyclen.    I discontinued her Ortho Evra patch per her request.

## 2023-10-05 NOTE — Telephone Encounter (Signed)
 Patient left voice mail today for refill request. She is requesting to go back to the tablet verses the patch. States the patch does not stay on skin well.  Med refill request: norethindrone-ethinyl estradiol  Last AEX: 03/02/23 BS Next AEX: 03/05/24 BS Last MMG (if hormonal med) n/a Refill authorized: Last Rx sent #9 patch with 3 refills on 03/02/23. Please approve or deny as appropriate.

## 2023-10-06 ENCOUNTER — Other Ambulatory Visit: Payer: Self-pay | Admitting: Obstetrics and Gynecology

## 2023-10-06 MED ORDER — NORGESTIMATE-ETH ESTRADIOL 0.25-35 MG-MCG PO TABS: 1.0000 | ORAL_TABLET | Freq: Every day | ORAL | 1 refills | Status: DC

## 2023-10-06 NOTE — Progress Notes (Signed)
 Rx for OrthoCyclen per patient request.

## 2023-10-17 ENCOUNTER — Other Ambulatory Visit: Payer: Self-pay

## 2023-10-17 NOTE — Telephone Encounter (Signed)
 Med refill request: BC Patch Last AEX: 03/02/2023-BS Next AEX: 03/05/2024-BS Last MMG (if hormonal med): 07/15/2022-WNL Refill authorized: rx denied due to change in therapy per encounter dated 5/7.   Encounter routed to provider for final review and encounter closed.

## 2024-03-01 NOTE — Progress Notes (Signed)
 43 y.o. [id number] Legally Separated Hispanic female here for annual exam. Current period has been on for almost 2 weeks per pt.    Menses usually last for 4 days.   She had her week off of her cycle during the placebo pills.  Now back on the pills and bleeding again.    She switched from the patch to pills because it was not sticking well to her skin.     PCP: Cleotilde Planas, MD   Patient's last menstrual period was 02/28/2024 (approximate).     Period Cycle (Days): 28 Period Duration (Days): 4 Period Pattern: Regular Menstrual Flow: Moderate Menstrual Control: Maxi pad, Tampon Dysmenorrhea: None     Sexually active: Yes.    The current method of family planning is OCP (estrogen/progesterone).    Menopausal hormone therapy:  n/a Exercising: Yes.    Walking  Smoker:  no  OB History  Gravida Para Term Preterm AB Living  2 2 2   2   SAB IAB Ectopic Multiple Live Births     0 2    # Outcome Date GA Lbr Len/2nd Weight Sex Type Anes PTL Lv  2 Term 01/21/16 [redacted]w[redacted]d 03:12 / 00:13 6 lb 9.1 oz (2.98 kg) F Vag-Spont None  LIV  1 Term 05/30/13 [redacted]w[redacted]d 15:01 / 00:48 6 lb 13.2 oz (3.096 kg) F Vag-Vacuum EPI  LIV     HEALTH MAINTENANCE: Last 2 paps:  03/02/23 neg, HR HPV neg, 02/19/20 ASCUS, HPV neg History of abnormal Pap or positive HPV:  yes Mammogram:   07/15/22 [facility] Cat 2 benign (Care everywhere) Colonoscopy:  n/a Bone Density:  n/a  Result  n/a   Immunization History  Administered Date(s) Administered   HPV Quadrivalent 03/28/2007, 05/28/2007, 09/26/2007   Influenza Inj Mdck Quad Pf 03/23/2018   Influenza,inj,Quad PF,6+ Mos 03/19/2021   Influenza,inj,quad, With Preservative 03/22/2018   Tdap 02/19/2021      reports that she has never smoked. She has never used smokeless tobacco. She reports current alcohol use. She reports that she does not use drugs.  Past Medical History:  Diagnosis Date   Anxiety 06/10/2021   Chlamydia 2014   Congenital uterine anomaly    septated  uterus, double cervices, longitudinal vaginal septum   Elevated bilirubin    Fatty liver    Gastritis    LSIL (low grade squamous intraepithelial lesion) on Pap smear    RIGHT - 10/10... ASCUS LEFT - NEGATIVE HR HPV 07/2009   Migraines    no aura   Obesity 03/19/2021    Past Surgical History:  Procedure Laterality Date   HYSTEROSCOPY  10/23/2009   EXCISION OF VAGINAL and UTERINE SEPTUM.     Current Outpatient Medications  Medication Sig Dispense Refill   hydrOXYzine (ATARAX) 25 MG tablet 1 tablet at bedtime as needed     meloxicam (MOBIC) 15 MG tablet Take 15 mg by mouth daily as needed.     norgestimate -ethinyl estradiol  (ORTHO-CYCLEN) 0.25-35 MG-MCG tablet Take 1 tablet by mouth daily. 84 tablet 1   No current facility-administered medications for this visit.    ALLERGIES: Patient has no known allergies.  Family History  Problem Relation Age of Onset   Hypertension Mother    Hypertension Sister    Thyroid disease Sister    Diabetes Father    Diabetes Paternal Grandmother     Review of Systems  All other systems reviewed and are negative.   PHYSICAL EXAM:  BP 114/76 (BP Location: Left Arm, Patient Position:  Sitting)   Pulse 72   Ht 5' 1.5 (1.562 m)   Wt 160 lb (72.6 kg)   LMP 02/28/2024 (Approximate)   SpO2 98%   BMI 29.74 kg/m     General appearance: alert, cooperative and appears stated age Head: normocephalic, without obvious abnormality, atraumatic Neck: no adenopathy, supple, symmetrical, trachea midline and thyroid normal to inspection and palpation Lungs: clear to auscultation bilaterally Breasts: normal appearance, no masses or tenderness, No nipple retraction or dimpling, No nipple discharge or bleeding, No axillary adenopathy Heart: regular rate and rhythm Abdomen: soft, non-tender; no masses, no organomegaly Extremities: extremities normal, atraumatic, no cyanosis or edema Skin: skin color, texture, turgor normal. No rashes or lesions Lymph  nodes: cervical, supraclavicular, and axillary nodes normal. Neurologic: grossly normal  Pelvic: External genitalia:  no lesions              No abnormal inguinal nodes palpated.              Urethra:  normal appearing urethra with no masses, tenderness or lesions              Bartholins and Skenes: normal                 Vagina: normal appearing vagina with normal color and discharge, no lesions.  Menstrual bleeding noted.  Scar present from prior excision of vaginal septum.                Cervix: no lesions.  Only one cervix seen and palpated.               Pap taken: no Bimanual Exam:  Uterus:  normal size, contour, position, consistency, mobility, non-tender              Adnexa: no mass, fullness, tenderness              Rectal exam: yes.  Confirms.              Anus:  normal sphincter tone, no lesions  Chaperone was present for exam:  Kari HERO, CMA  ASSESSMENT: Well woman visit with gynecologic exam. Hx septate uterus, vaginal septum, and double cervix.  Status post excision of vaginal and uterine septum.  Abnormal uterine bleeding.  Migraine HA. No aura. Hx elevated bilirubin.  Fatty liver. PHQ-2-9: 0  PLAN: Mammogram screening discussed.  She will schedule.  Self breast awareness reviewed. Pap and HRV collected:  no.  Due in 2029.  Guidelines for Calcium, Vitamin D, regular exercise program including cardiovascular and weight bearing exercise. UPT.  STD screening.  Medication refills:  Ortho-Cyclen, 3 packs and 3 refills.  Patient will double up for 5 days, then take one pill by mouth daily, skip placebo pills, and go directly into a new pack.  If bleeding persists, pelvic US .  Routine labs with PCP. Follow up:  yearly and prn.

## 2024-03-05 ENCOUNTER — Other Ambulatory Visit (HOSPITAL_COMMUNITY)
Admission: RE | Admit: 2024-03-05 | Discharge: 2024-03-05 | Disposition: A | Discharge status: Home / Self Care | Source: Ambulatory Visit | Attending: Obstetrics and Gynecology | Admitting: Obstetrics and Gynecology

## 2024-03-05 ENCOUNTER — Encounter: Payer: Self-pay | Admitting: Obstetrics and Gynecology

## 2024-03-05 ENCOUNTER — Ambulatory Visit (INDEPENDENT_AMBULATORY_CARE_PROVIDER_SITE_OTHER): Payer: 59 | Admitting: Obstetrics and Gynecology

## 2024-03-05 ENCOUNTER — Ambulatory Visit: Payer: Self-pay | Admitting: Obstetrics and Gynecology

## 2024-03-05 VITALS — BP 114/76 | HR 72 | Ht 61.5 in | Wt 160.0 lb

## 2024-03-05 DIAGNOSIS — Z1159 Encounter for screening for other viral diseases: Secondary | ICD-10-CM

## 2024-03-05 DIAGNOSIS — Z1331 Encounter for screening for depression: Secondary | ICD-10-CM

## 2024-03-05 DIAGNOSIS — Z01419 Encounter for gynecological examination (general) (routine) without abnormal findings: Secondary | ICD-10-CM | POA: Diagnosis not present

## 2024-03-05 DIAGNOSIS — Z113 Encounter for screening for infections with a predominantly sexual mode of transmission: Secondary | ICD-10-CM

## 2024-03-05 DIAGNOSIS — N939 Abnormal uterine and vaginal bleeding, unspecified: Secondary | ICD-10-CM

## 2024-03-05 DIAGNOSIS — Z114 Encounter for screening for human immunodeficiency virus [HIV]: Secondary | ICD-10-CM

## 2024-03-05 LAB — PREGNANCY, URINE: Preg Test, Ur: NEGATIVE

## 2024-03-05 MED ORDER — NORGESTIMATE-ETH ESTRADIOL 0.25-35 MG-MCG PO TABS: 1.0000 | ORAL_TABLET | Freq: Every day | ORAL | 3 refills | Status: AC

## 2024-03-05 NOTE — Patient Instructions (Signed)

## 2024-03-06 LAB — CERVICOVAGINAL ANCILLARY ONLY
Chlamydia: NEGATIVE
Comment: NEGATIVE
Comment: NEGATIVE
Comment: NORMAL
Neisseria Gonorrhea: NEGATIVE
Trichomonas: NEGATIVE

## 2024-03-06 LAB — HIV ANTIBODY (ROUTINE TESTING W REFLEX)
HIV 1&2 Ab, 4th Generation: NONREACTIVE
HIV FINAL INTERPRETATION: NEGATIVE

## 2024-03-06 LAB — RPR: RPR Ser Ql: NONREACTIVE

## 2024-03-06 LAB — HEPATITIS C ANTIBODY: Hepatitis C Ab: NONREACTIVE

## 2024-03-15 ENCOUNTER — Encounter: Payer: Self-pay | Admitting: Obstetrics and Gynecology

## 2024-03-15 DIAGNOSIS — N939 Abnormal uterine and vaginal bleeding, unspecified: Secondary | ICD-10-CM

## 2024-03-16 NOTE — Telephone Encounter (Signed)
 Per review of 03/05/24 OV notes: Ortho-Cyclen, 3 packs and 3 refills.  Patient will double up for 5 days, then take one pill by mouth daily, skip placebo pills, and go directly into a new pack.  If bleeding persists, pelvic US .

## 2024-03-20 NOTE — Telephone Encounter (Signed)
 Spoke with patient. PUS scheduled for 04/02/24 at 1400, OV for consult with Dr. Nikki on 11/3 at 1400.   PUS order placed.   Patient aware to continue to calendar bleeding. Call for heavy bleeding or if any new symptoms develop.   Routing to provider for final review. Patient is agreeable to disposition. Will close encounter.

## 2024-03-22 ENCOUNTER — Ambulatory Visit (INDEPENDENT_AMBULATORY_CARE_PROVIDER_SITE_OTHER)

## 2024-03-22 DIAGNOSIS — N939 Abnormal uterine and vaginal bleeding, unspecified: Secondary | ICD-10-CM | POA: Diagnosis not present

## 2024-03-29 ENCOUNTER — Telehealth: Payer: Self-pay | Admitting: Obstetrics and Gynecology

## 2024-03-29 DIAGNOSIS — N939 Abnormal uterine and vaginal bleeding, unspecified: Secondary | ICD-10-CM

## 2024-03-29 DIAGNOSIS — R9389 Abnormal findings on diagnostic imaging of other specified body structures: Secondary | ICD-10-CM

## 2024-03-29 NOTE — Telephone Encounter (Signed)
 Please let patient know that I reviewed her ultrasound and there is a thickening of the lining of her uterus.  She may have a polyp growing inside her uterus that is causing the irregular bleeding.    I recommend doing an endometrial biopsy at her appointment with me next week so we can diagnose this area better and get her bleeding controlled.   Please give her a brief explanation of procedure and recommend Motrin  800 mg 1 hour prior to procedure if she is able to take this kind of medication.

## 2024-03-29 NOTE — Progress Notes (Signed)
 GYNECOLOGY  VISIT   HPI: 43 y.o.   Legally Separated  Hispanic female   [phone] with Patient's last menstrual period was 02/28/2024 (approximate).   here for: U/S Consult and endometrial biopsy for abnormal uterine bleeding on birth control pills and thickened echogenic endometrium on ultrasound.    Started the placebo pills 5 days ago. Bleeding was light and now has increased in the last 3 days.   She has been bleeding in total for about 6 weeks.    Doubling up on her birth control pills did not help her bleeding.   Pelvic US  03/22/24: Uterus:  8.4 cm x 6.2 cm. 4.38 cm.  Anteverted.  No myometrial masses.  EMS 9.01 mm.  8.8 x 8.5 mm echogenic area with feeder vessel.  Left ovary 2.46 x 1.42 x 1.75 cm.  Normal.  Right ovary 2.17 x 1.63 x 1.46 cm.  Normal.  No adnexal masses.  No free fluid.  Dilated uterine and adnexal vessels, consistent with possible pelvic congestion.  Neg GC/CT/trich on 03/05/24.  Sexually active but not currently due to bleeding.   UPT negative.   GYNECOLOGIC HISTORY: Patient's last menstrual period was 02/28/2024 (approximate). Contraception:  OCP Menopausal hormone therapy:  n/a Last 2 paps:  03/02/23 neg, HR HPV neg, 02/19/20 ASCUS, HPV neg   History of abnormal Pap or positive HPV:  yes Mammogram:  07/15/22 [facility] Cat 2 benign (Care everywhere)          OB History     Gravida  2   Para  2   Term  2   Preterm      AB      Living  2      SAB      IAB      Ectopic      Multiple  0   Live Births  2              Patient Active Problem List   Diagnosis Date Noted   Postpartum care following vaginal delivery(8/23) 01/21/2016    Past Medical History:  Diagnosis Date   Anxiety 06/10/2021   Chlamydia 2014   Congenital uterine anomaly    septated uterus, double cervices, longitudinal vaginal septum   Elevated bilirubin    Fatty liver    Gastritis    LSIL (low grade squamous intraepithelial lesion) on Pap smear    RIGHT  - 10/10... ASCUS LEFT - NEGATIVE HR HPV 07/2009   Migraines    no aura   Obesity 03/19/2021    Past Surgical History:  Procedure Laterality Date   HYSTEROSCOPY  10/23/2009   EXCISION OF VAGINAL and UTERINE SEPTUM.     Current Outpatient Medications  Medication Sig Dispense Refill   meloxicam (MOBIC) 15 MG tablet Take 15 mg by mouth daily as needed.     norgestimate -ethinyl estradiol  (ORTHO-CYCLEN) 0.25-35 MG-MCG tablet Take 1 tablet by mouth daily. 84 tablet 3   No current facility-administered medications for this visit.     ALLERGIES: Patient has no known allergies.  Family History  Problem Relation Age of Onset   Hypertension Mother    Hypertension Sister    Thyroid disease Sister    Diabetes Father    Diabetes Paternal Grandmother     Social History   Socioeconomic History   Marital status: Legally Separated    Spouse name: Not on file   Number of children: Not on file   Years of education: Not on file   Highest  education level: Not on file  Occupational History   Not on file  Tobacco Use   Smoking status: Never   Smokeless tobacco: Never  Vaping Use   Vaping status: Never Used  Substance and Sexual Activity   Alcohol use: Yes    Comment: 1 drink/month   Drug use: No   Sexual activity: Yes    Birth control/protection: Condom, Pill    Comment: 1st intercourse 71 yo-1 partner  Other Topics Concern   Not on file  Social History Narrative   Not on file   Social Drivers of Health   Financial Resource Strain: Not on file  Food Insecurity: Not on file  Transportation Needs: Not on file  Physical Activity: Not on file  Stress: Not on file  Social Connections: Unknown (10/11/2021)   Received from [facility]   Social Network    Social Network: Not on file  Intimate Partner Violence: Unknown (09/03/2021)   Received from [facility]   HITS    Physically Hurt: Not on file    Insult or Talk Down To: Not on file    Threaten Physical Harm: Not on file     Scream or Curse: Not on file    Review of Systems  All other systems reviewed and are negative.   PHYSICAL EXAMINATION:   BP 126/84 (BP Location: Left Arm, Patient Position: Sitting)   Pulse 86   LMP 02/28/2024 (Approximate)   SpO2 98%     General appearance: alert, cooperative and appears stated age  EMB Consent and time out done.  Blood noted in vagina.   Remnant of vaginal septum noted.  Hibiclens prep to cervix.  Tenaculum to anterior cervical lip.  Pipelle passed to 7 cm x 3 after os finder used to dilate os.   Tissue to pathology.  No complications.  Minimal EBL.   Chaperone was present for exam:  Kari HERO, CMA  ASSESSMENT:  Abnormal uterine bleeding on combined oral contraception.  Thickened endometrium and feeder vessel on US , suspicious for potential polyp.  Possible pelvic congestion.  Hx septate uterus, vaginal septum, and double cervix.  Status post excision of vaginal and uterine septum.   PLAN:  US  images and report reviewed.  FU EMB.  Post EMB precautions given.  If EMB does not correlate with US  results, will consider sonohysterogram versus proceeding with hysteroscopy/dilation and curettage.  Final plan to follow.  20 min  total time was spent for this patient encounter, including preparation, face-to-face counseling with the patient, coordination of care, and documentation of the encounter in addition to doing the endometrial biopsy.

## 2024-04-02 ENCOUNTER — Other Ambulatory Visit (HOSPITAL_COMMUNITY)
Admission: RE | Admit: 2024-04-02 | Discharge: 2024-04-02 | Disposition: A | Discharge status: Home / Self Care | Source: Ambulatory Visit | Attending: Obstetrics and Gynecology | Admitting: Obstetrics and Gynecology

## 2024-04-02 ENCOUNTER — Encounter: Payer: Self-pay | Admitting: Obstetrics and Gynecology

## 2024-04-02 ENCOUNTER — Ambulatory Visit: Admitting: Obstetrics and Gynecology

## 2024-04-02 VITALS — BP 126/84 | HR 86

## 2024-04-02 DIAGNOSIS — Z01812 Encounter for preprocedural laboratory examination: Secondary | ICD-10-CM | POA: Diagnosis not present

## 2024-04-02 DIAGNOSIS — N939 Abnormal uterine and vaginal bleeding, unspecified: Secondary | ICD-10-CM | POA: Insufficient documentation

## 2024-04-02 DIAGNOSIS — R9389 Abnormal findings on diagnostic imaging of other specified body structures: Secondary | ICD-10-CM | POA: Diagnosis not present

## 2024-04-02 LAB — PREGNANCY, URINE: Preg Test, Ur: NEGATIVE

## 2024-04-02 NOTE — Telephone Encounter (Signed)
 Encounter reviewed and closed.

## 2024-04-02 NOTE — Patient Instructions (Signed)
 Endometrial Biopsy  An endometrial biopsy is a procedure where a tissue sample is removed from the lining of the uterus. This lining is called the endometrium. The tissue sample is then sent to a lab for testing. You may have this type of biopsy to check for: Cancer. Infection. Growths called polyps. Uterine bleeding that can't be explained. Tell a health care provider about: Any allergies you have. All medicines you're taking including vitamins, herbs, eye drops, creams, and over-the-counter medicines. Any problems you or family members have had with anesthesia. Any bleeding problems you have. Any surgeries you have had. Any medical problems you have. Whether you're pregnant or may be pregnant. What are the risks? Your health care provider will talk with you about risks. These may include: Bleeding. Infection. Allergic reactions to medicines. Damage to the wall of the uterus. This is rare. What happens before the procedure? Keep track of your period. You may need to have this biopsy when you're not having your period. Ask your provider about: Changing or stopping your regular medicines. These include any diabetes medicines or blood thinners you take. Taking medicines such as aspirin and ibuprofen. These medicines can thin your blood. Do not take them unless your provider tells you to. Taking over-the-counter medicines, vitamins, herbs, and supplements. Bring a pad with you. You may need to wear one after the biopsy. Plan to have a responsible adult take you home from the hospital or clinic. You won't be allowed to drive. What happens during the procedure? A tool will be put into your vagina to hold it open. This helps your provider see the cervix. The cervix is the lowest part of the uterus. Your cervix will be cleaned with a solution that kills germs. You will be given anesthesia. This keeps you from feeling pain. It will numb your cervix. A tool called forceps will be used to  hold your cervix steady. A thin tool called a uterine sound will be put through your cervix. It will be used to: Find the length of your uterus. Find where to take the sample from. A soft tube called a catheter will be put into your uterus. The catheter will remove a tissue sample. The tube and tools will be removed. The sample will be sent to a lab for testing. The procedure may vary among providers and hospitals. What happens after the procedure? Your blood pressure, heart rate, breathing rate, and blood oxygen level will be monitored until you leave the hospital or clinic. It's up to you to get the results of your procedure. Ask your provider, or the department that is doing the procedure, when your results will be ready. This information is not intended to replace advice given to you by your health care provider. Make sure you discuss any questions you have with your health care provider. Document Revised: 07/27/2022 Document Reviewed: 07/27/2022 Elsevier Patient Education  2024 [employer].

## 2024-04-05 LAB — SURGICAL PATHOLOGY

## 2024-04-06 ENCOUNTER — Ambulatory Visit: Payer: Self-pay | Admitting: Obstetrics and Gynecology

## 2025-03-12 ENCOUNTER — Ambulatory Visit: Admitting: Obstetrics and Gynecology
